# Patient Record
Sex: Female | Born: 1960 | Hispanic: Yes | Marital: Married | Smoking: Former smoker
Health system: Southern US, Community
[De-identification: ages and names within clinical notes are randomized; demographics above are authoritative.]

## PROBLEM LIST (undated history)

## (undated) DIAGNOSIS — E538 Deficiency of other specified B group vitamins: Secondary | ICD-10-CM

## (undated) DIAGNOSIS — E559 Vitamin D deficiency, unspecified: Secondary | ICD-10-CM

## (undated) DIAGNOSIS — M810 Age-related osteoporosis without current pathological fracture: Secondary | ICD-10-CM

## (undated) DIAGNOSIS — E785 Hyperlipidemia, unspecified: Secondary | ICD-10-CM

## (undated) HISTORY — PX: APPENDECTOMY: SHX54

## (undated) HISTORY — DX: Age-related osteoporosis without current pathological fracture: M81.0

## (undated) HISTORY — DX: Hyperlipidemia, unspecified: E78.5

## (undated) HISTORY — DX: Vitamin D deficiency, unspecified: E55.9

## (undated) HISTORY — DX: Deficiency of other specified B group vitamins: E53.8

---

## 2006-03-09 ENCOUNTER — Other Ambulatory Visit: Admission: RE | Admit: 2006-03-09 | Discharge: 2006-03-09 | Payer: Self-pay | Admitting: Family Medicine

## 2007-05-26 ENCOUNTER — Other Ambulatory Visit: Admission: RE | Admit: 2007-05-26 | Discharge: 2007-05-26 | Payer: Self-pay | Admitting: Family Medicine

## 2008-09-11 ENCOUNTER — Other Ambulatory Visit: Admission: RE | Admit: 2008-09-11 | Discharge: 2008-09-11 | Payer: Self-pay | Admitting: Family Medicine

## 2009-05-02 ENCOUNTER — Encounter: Admission: RE | Admit: 2009-05-02 | Discharge: 2009-05-02 | Payer: Self-pay | Admitting: Internal Medicine

## 2010-07-01 ENCOUNTER — Other Ambulatory Visit (HOSPITAL_COMMUNITY)
Admission: RE | Admit: 2010-07-01 | Discharge: 2010-07-01 | Disposition: A | Discharge status: Home / Self Care | Payer: Federal, State, Local not specified - PPO | Source: Ambulatory Visit | Attending: Family Medicine | Admitting: Family Medicine

## 2010-07-01 ENCOUNTER — Other Ambulatory Visit: Payer: Self-pay | Admitting: Family Medicine

## 2010-07-01 DIAGNOSIS — Z1159 Encounter for screening for other viral diseases: Secondary | ICD-10-CM | POA: Insufficient documentation

## 2010-07-01 DIAGNOSIS — Z124 Encounter for screening for malignant neoplasm of cervix: Secondary | ICD-10-CM | POA: Insufficient documentation

## 2013-11-25 ENCOUNTER — Other Ambulatory Visit: Payer: Self-pay | Admitting: Family Medicine

## 2013-11-25 ENCOUNTER — Other Ambulatory Visit (HOSPITAL_COMMUNITY)
Admission: RE | Admit: 2013-11-25 | Discharge: 2013-11-25 | Disposition: A | Discharge status: Home / Self Care | Payer: Federal, State, Local not specified - PPO | Source: Ambulatory Visit | Attending: Family Medicine | Admitting: Family Medicine

## 2013-11-25 DIAGNOSIS — Z1151 Encounter for screening for human papillomavirus (HPV): Secondary | ICD-10-CM | POA: Insufficient documentation

## 2013-11-25 DIAGNOSIS — Z124 Encounter for screening for malignant neoplasm of cervix: Secondary | ICD-10-CM | POA: Insufficient documentation

## 2013-11-29 ENCOUNTER — Other Ambulatory Visit: Payer: Self-pay | Admitting: Family Medicine

## 2013-11-29 ENCOUNTER — Ambulatory Visit
Admission: RE | Admit: 2013-11-29 | Discharge: 2013-11-29 | Disposition: A | Discharge status: Home / Self Care | Payer: Federal, State, Local not specified - PPO | Source: Ambulatory Visit | Attending: Family Medicine | Admitting: Family Medicine

## 2013-11-29 DIAGNOSIS — M25551 Pain in right hip: Secondary | ICD-10-CM

## 2013-11-29 DIAGNOSIS — M545 Low back pain: Secondary | ICD-10-CM

## 2013-11-30 LAB — CYTOLOGY - PAP

## 2015-02-05 ENCOUNTER — Other Ambulatory Visit: Payer: Self-pay | Admitting: Family Medicine

## 2015-02-05 ENCOUNTER — Ambulatory Visit
Admission: RE | Admit: 2015-02-05 | Discharge: 2015-02-05 | Disposition: A | Discharge status: Home / Self Care | Payer: Federal, State, Local not specified - PPO | Source: Ambulatory Visit | Attending: Family Medicine | Admitting: Family Medicine

## 2015-02-05 DIAGNOSIS — M549 Dorsalgia, unspecified: Secondary | ICD-10-CM

## 2015-09-20 DIAGNOSIS — K219 Gastro-esophageal reflux disease without esophagitis: Secondary | ICD-10-CM | POA: Insufficient documentation

## 2015-09-20 DIAGNOSIS — R09A2 Foreign body sensation, throat: Secondary | ICD-10-CM | POA: Insufficient documentation

## 2015-09-20 DIAGNOSIS — R490 Dysphonia: Secondary | ICD-10-CM | POA: Insufficient documentation

## 2018-02-10 ENCOUNTER — Other Ambulatory Visit (HOSPITAL_COMMUNITY)
Admission: RE | Admit: 2018-02-10 | Discharge: 2018-02-10 | Disposition: A | Discharge status: Home / Self Care | Payer: Federal, State, Local not specified - PPO | Source: Ambulatory Visit | Attending: Family Medicine | Admitting: Family Medicine

## 2018-02-10 ENCOUNTER — Other Ambulatory Visit: Payer: Self-pay | Admitting: Family Medicine

## 2018-02-10 DIAGNOSIS — E785 Hyperlipidemia, unspecified: Secondary | ICD-10-CM | POA: Diagnosis not present

## 2018-02-10 DIAGNOSIS — K219 Gastro-esophageal reflux disease without esophagitis: Secondary | ICD-10-CM | POA: Diagnosis not present

## 2018-02-10 DIAGNOSIS — R350 Frequency of micturition: Secondary | ICD-10-CM | POA: Diagnosis not present

## 2018-02-10 DIAGNOSIS — Z0000 Encounter for general adult medical examination without abnormal findings: Secondary | ICD-10-CM | POA: Diagnosis not present

## 2018-02-10 DIAGNOSIS — Z23 Encounter for immunization: Secondary | ICD-10-CM | POA: Diagnosis not present

## 2018-02-10 DIAGNOSIS — Z124 Encounter for screening for malignant neoplasm of cervix: Secondary | ICD-10-CM | POA: Diagnosis not present

## 2018-02-10 DIAGNOSIS — E559 Vitamin D deficiency, unspecified: Secondary | ICD-10-CM | POA: Diagnosis not present

## 2018-02-11 ENCOUNTER — Other Ambulatory Visit: Payer: Self-pay | Admitting: Family Medicine

## 2018-02-11 DIAGNOSIS — N644 Mastodynia: Secondary | ICD-10-CM

## 2018-02-12 LAB — CYTOLOGY - PAP
Diagnosis: NEGATIVE
HPV: NOT DETECTED

## 2018-03-12 ENCOUNTER — Ambulatory Visit
Admission: RE | Admit: 2018-03-12 | Discharge: 2018-03-12 | Disposition: A | Discharge status: Home / Self Care | Payer: Federal, State, Local not specified - PPO | Source: Ambulatory Visit | Attending: Family Medicine | Admitting: Family Medicine

## 2018-03-12 ENCOUNTER — Ambulatory Visit: Payer: Federal, State, Local not specified - PPO

## 2018-03-12 DIAGNOSIS — R922 Inconclusive mammogram: Secondary | ICD-10-CM | POA: Diagnosis not present

## 2018-03-12 DIAGNOSIS — N644 Mastodynia: Secondary | ICD-10-CM

## 2018-03-15 DIAGNOSIS — M810 Age-related osteoporosis without current pathological fracture: Secondary | ICD-10-CM | POA: Diagnosis not present

## 2018-03-15 DIAGNOSIS — Z780 Asymptomatic menopausal state: Secondary | ICD-10-CM | POA: Diagnosis not present

## 2018-03-18 DIAGNOSIS — G8929 Other chronic pain: Secondary | ICD-10-CM | POA: Diagnosis not present

## 2018-03-18 DIAGNOSIS — E559 Vitamin D deficiency, unspecified: Secondary | ICD-10-CM | POA: Diagnosis not present

## 2018-03-18 DIAGNOSIS — E2839 Other primary ovarian failure: Secondary | ICD-10-CM | POA: Diagnosis not present

## 2018-03-18 DIAGNOSIS — M810 Age-related osteoporosis without current pathological fracture: Secondary | ICD-10-CM | POA: Diagnosis not present

## 2018-03-29 DIAGNOSIS — M546 Pain in thoracic spine: Secondary | ICD-10-CM | POA: Diagnosis not present

## 2018-03-29 DIAGNOSIS — M542 Cervicalgia: Secondary | ICD-10-CM | POA: Diagnosis not present

## 2018-04-05 DIAGNOSIS — M542 Cervicalgia: Secondary | ICD-10-CM | POA: Diagnosis not present

## 2018-04-05 DIAGNOSIS — M546 Pain in thoracic spine: Secondary | ICD-10-CM | POA: Diagnosis not present

## 2018-04-09 DIAGNOSIS — M546 Pain in thoracic spine: Secondary | ICD-10-CM | POA: Diagnosis not present

## 2018-04-09 DIAGNOSIS — M542 Cervicalgia: Secondary | ICD-10-CM | POA: Diagnosis not present

## 2018-11-28 DIAGNOSIS — R079 Chest pain, unspecified: Secondary | ICD-10-CM | POA: Diagnosis not present

## 2018-11-28 DIAGNOSIS — R0781 Pleurodynia: Secondary | ICD-10-CM | POA: Diagnosis not present

## 2019-06-24 DIAGNOSIS — Z719 Counseling, unspecified: Secondary | ICD-10-CM | POA: Insufficient documentation

## 2019-06-29 DIAGNOSIS — L218 Other seborrheic dermatitis: Secondary | ICD-10-CM | POA: Diagnosis not present

## 2019-06-29 DIAGNOSIS — L728 Other follicular cysts of the skin and subcutaneous tissue: Secondary | ICD-10-CM | POA: Diagnosis not present

## 2019-08-12 DIAGNOSIS — L814 Other melanin hyperpigmentation: Secondary | ICD-10-CM | POA: Diagnosis not present

## 2020-04-26 DIAGNOSIS — L718 Other rosacea: Secondary | ICD-10-CM | POA: Diagnosis not present

## 2020-04-26 DIAGNOSIS — L738 Other specified follicular disorders: Secondary | ICD-10-CM | POA: Diagnosis not present

## 2020-08-28 IMAGING — MG DIGITAL DIAGNOSTIC BILATERAL MAMMOGRAM WITH TOMO AND CAD
6 of 10 series · 6 of 30 positions shown · non-contrast
Comparison: New baseline exam

CLINICAL DATA: The patient describes diffuse bilateral pain, worse
on the LEFT. LEFT breast feels heavy at times. The patient reports
drinking a lot more caffeine recently.

EXAM:
DIGITAL DIAGNOSTIC BILATERAL MAMMOGRAM WITH CAD AND TOMO

[R MLO synth-2D]
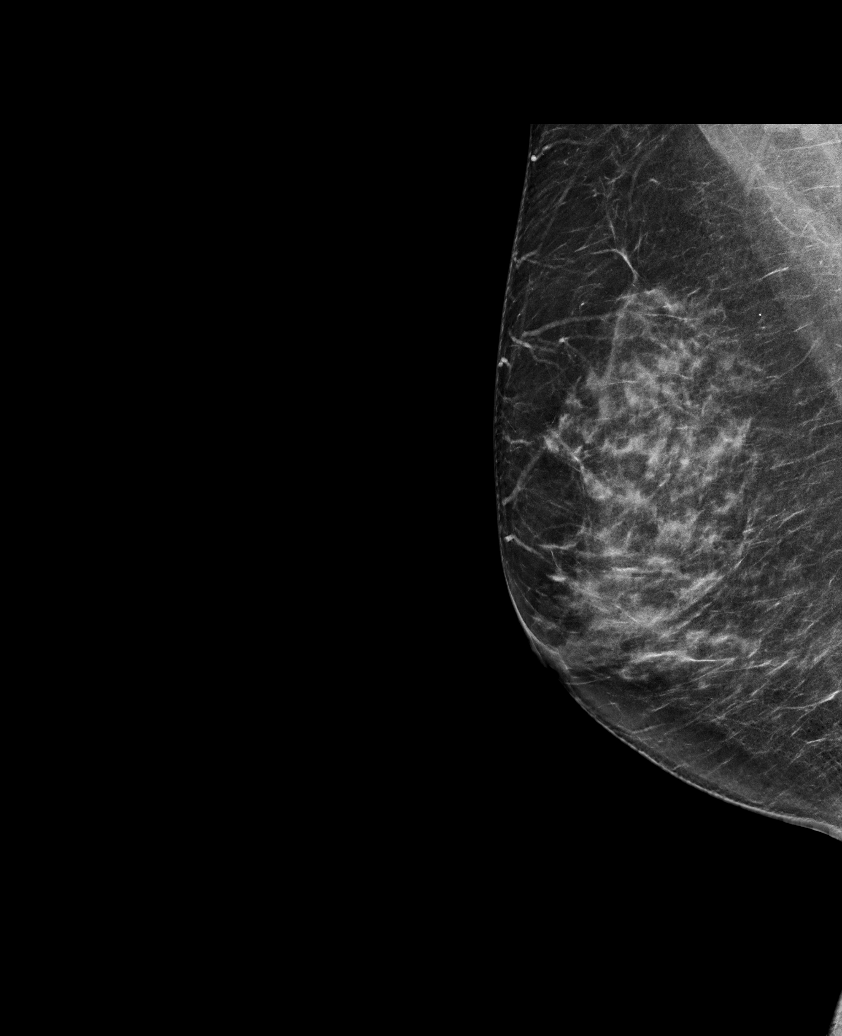

[L CC synth-2D (1 of 2)]
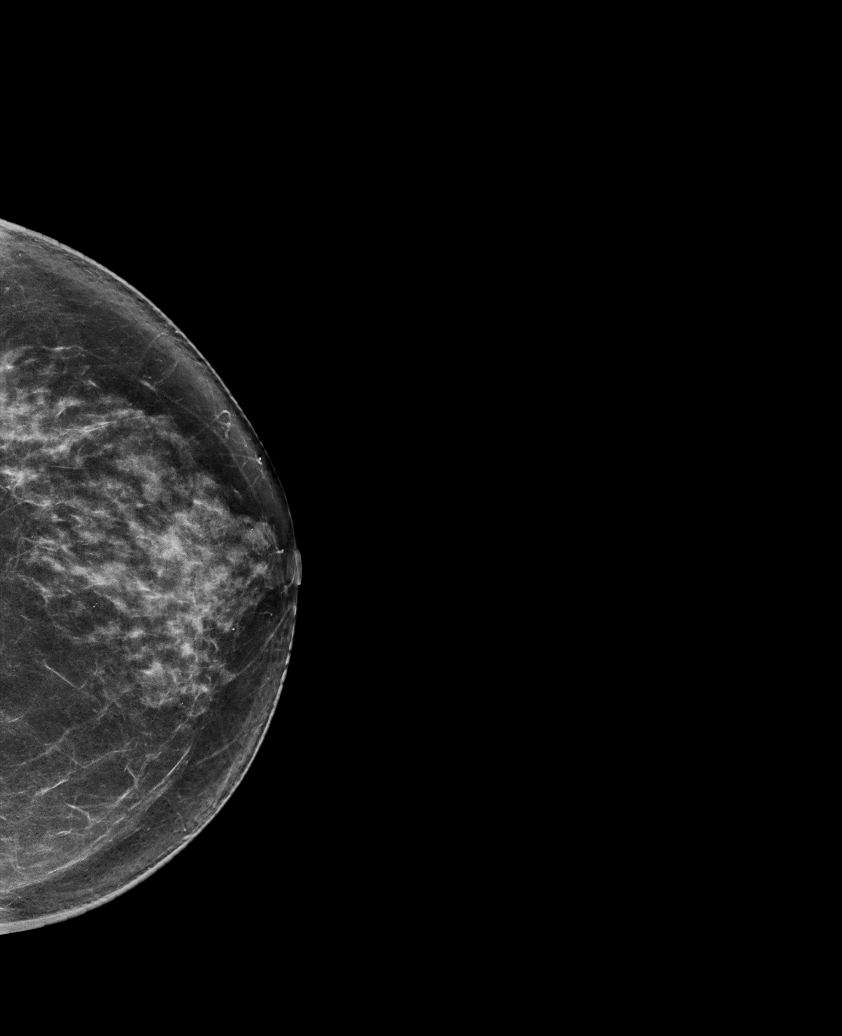

[L MLO synth-2D]
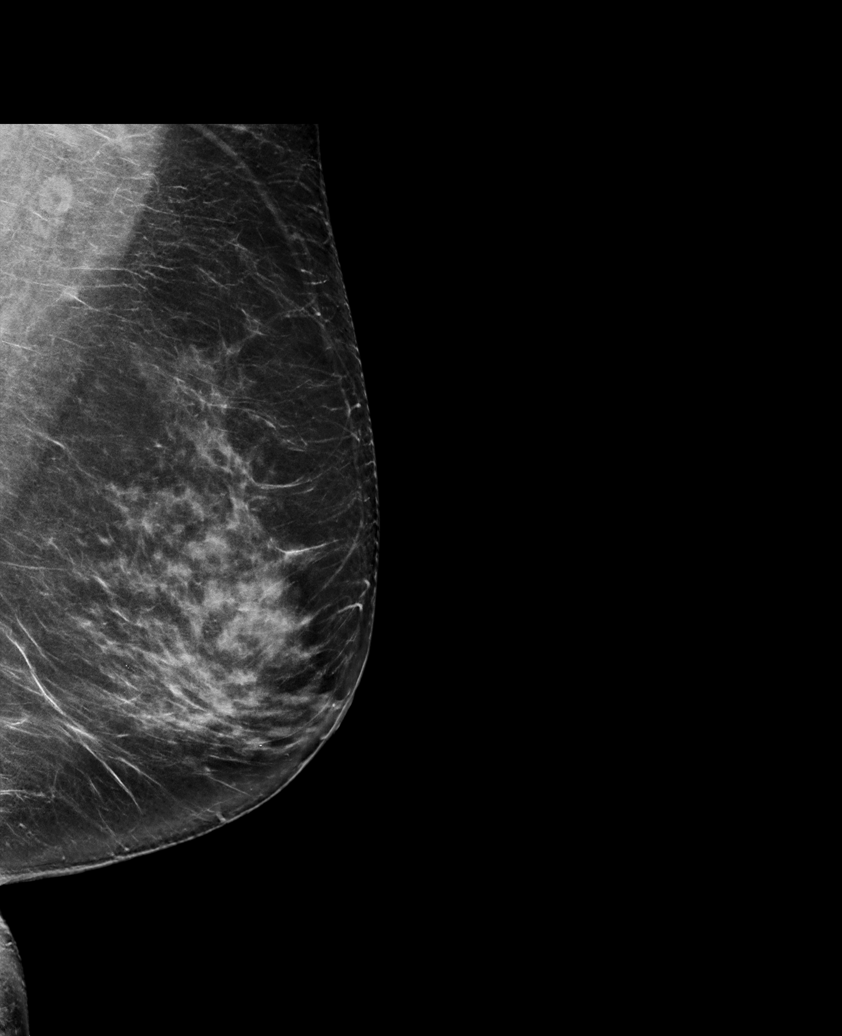

[L CC synth-2D (2 of 2)]
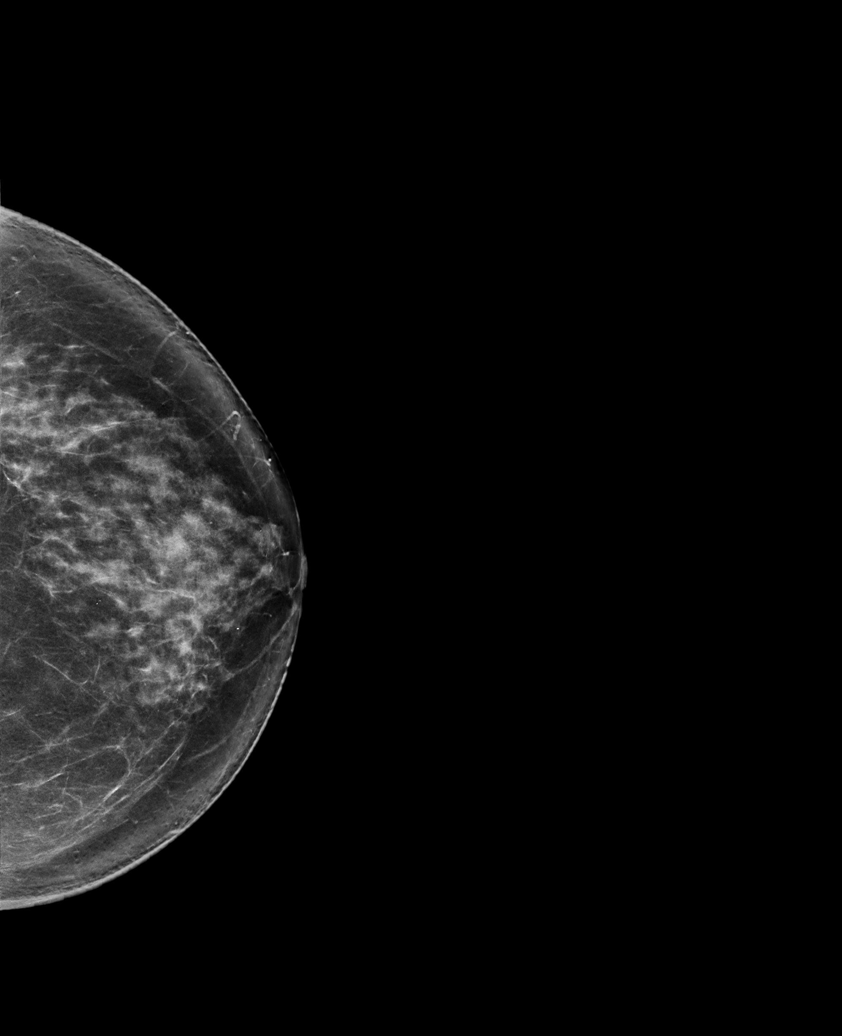

[R CC synth-2D]
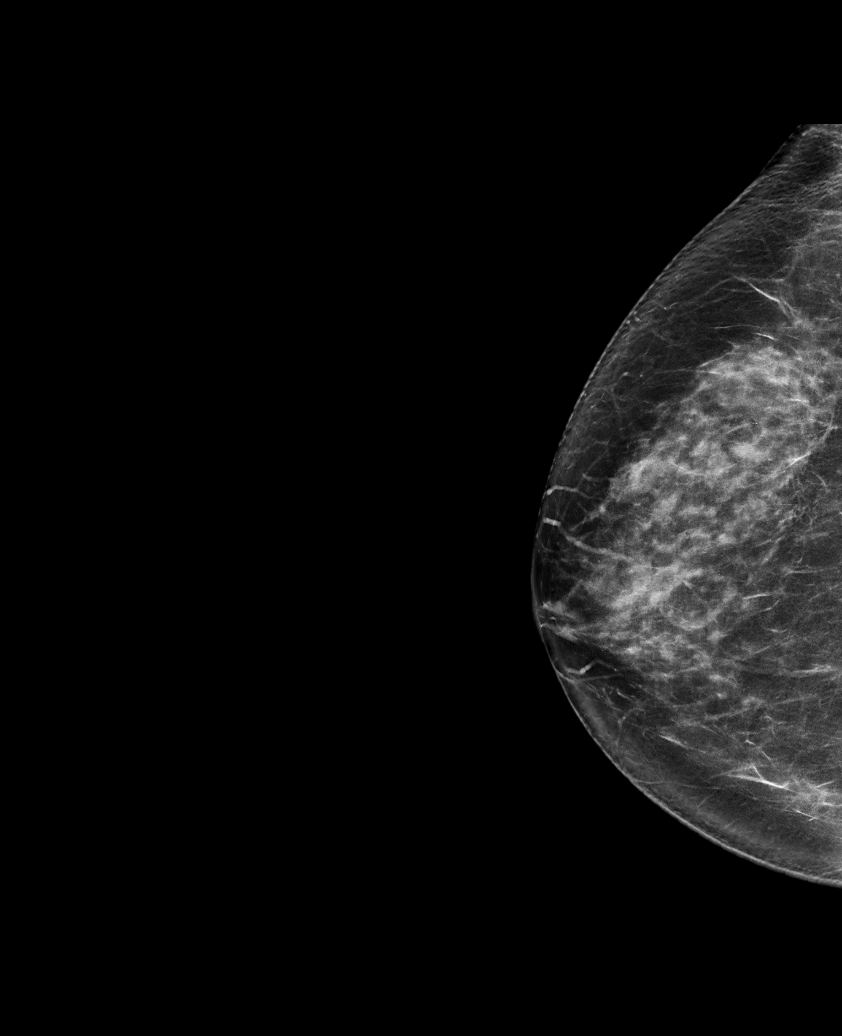

[L CC tomo · tomo slice 42/83.0]
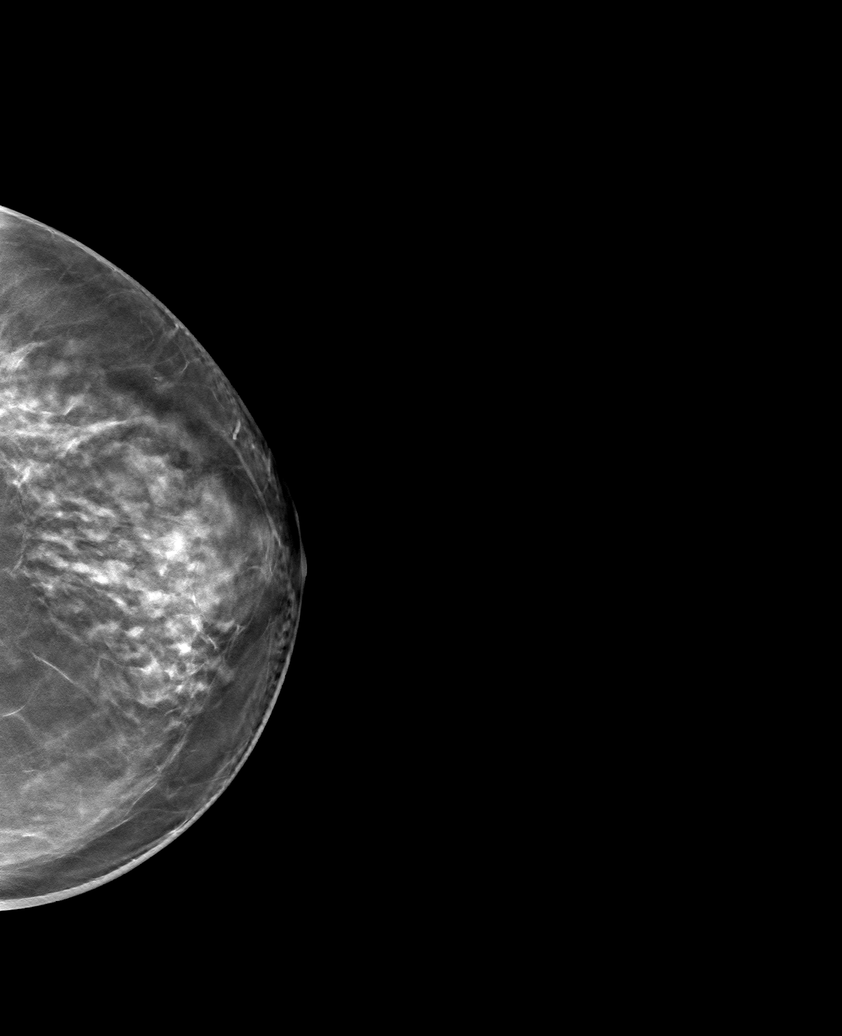

[6 of 30 positions shown; findings below may reference images not displayed]

ACR Breast Density Category c: The breast tissue is heterogeneously
dense, which may obscure small masses.
FINDINGS: No suspicious mass, distortion, or microcalcifications are
identified to suggest presence of malignancy.

Mammographic images were processed with CAD.
IMPRESSION: No mammographic evidence for malignancy.We discussed some of the
issues regarding breast pain, including limiting caffeine.

RECOMMENDATION:
Screening mammogram in one year.(Code:64-1-G79)

I have discussed the findings and recommendations with the patient.
Results were also provided in writing at the conclusion of the
visit. If applicable, a reminder letter will be sent to the patient
regarding the next appointment.

BI-RADS CATEGORY  1: Negative.

## 2020-09-17 DIAGNOSIS — E559 Vitamin D deficiency, unspecified: Secondary | ICD-10-CM | POA: Diagnosis not present

## 2020-09-17 DIAGNOSIS — Z0000 Encounter for general adult medical examination without abnormal findings: Secondary | ICD-10-CM | POA: Diagnosis not present

## 2020-09-17 DIAGNOSIS — Z1321 Encounter for screening for nutritional disorder: Secondary | ICD-10-CM | POA: Diagnosis not present

## 2020-09-17 DIAGNOSIS — E785 Hyperlipidemia, unspecified: Secondary | ICD-10-CM | POA: Diagnosis not present

## 2020-10-03 LAB — LIPID PANEL
Cholesterol: 256 — AB (ref 0–200)
HDL: 208 — AB (ref 35–70)
LDL Cholesterol: 191
LDl/HDL Ratio: 5.3
Triglycerides: 97 (ref 40–160)

## 2020-10-03 LAB — COMPREHENSIVE METABOLIC PANEL: Calcium: 9.5 (ref 8.7–10.7)

## 2020-10-03 LAB — CBC AND DIFFERENTIAL
HCT: 41 (ref 36–46)
Hemoglobin: 13.7 (ref 12.0–16.0)
WBC: 6

## 2020-10-03 LAB — BASIC METABOLIC PANEL
Creatinine: 0.8 (ref 0.5–1.1)
Glucose: 90

## 2020-10-03 LAB — HEPATIC FUNCTION PANEL
ALT: 20 (ref 7–35)
AST: 20 (ref 13–35)

## 2020-10-03 LAB — VITAMIN B12: Vitamin B-12: 248

## 2020-10-03 LAB — CBC: RBC: 4.54 (ref 3.87–5.11)

## 2020-10-03 LAB — VITAMIN D 25 HYDROXY (VIT D DEFICIENCY, FRACTURES): Vit D, 25-Hydroxy: 22.2

## 2020-10-04 DIAGNOSIS — M85851 Other specified disorders of bone density and structure, right thigh: Secondary | ICD-10-CM | POA: Diagnosis not present

## 2020-10-04 DIAGNOSIS — Z1231 Encounter for screening mammogram for malignant neoplasm of breast: Secondary | ICD-10-CM | POA: Diagnosis not present

## 2020-10-04 DIAGNOSIS — M810 Age-related osteoporosis without current pathological fracture: Secondary | ICD-10-CM | POA: Diagnosis not present

## 2020-10-18 ENCOUNTER — Other Ambulatory Visit: Payer: Self-pay

## 2020-10-19 ENCOUNTER — Encounter: Payer: Self-pay | Admitting: Internal Medicine

## 2020-10-19 ENCOUNTER — Ambulatory Visit: Payer: Federal, State, Local not specified - PPO | Admitting: Internal Medicine

## 2020-10-19 VITALS — BP 110/70 | HR 59 | Temp 98.7°F | Ht 59.06 in | Wt 126.1 lb

## 2020-10-19 DIAGNOSIS — E782 Mixed hyperlipidemia: Secondary | ICD-10-CM

## 2020-10-19 DIAGNOSIS — E538 Deficiency of other specified B group vitamins: Secondary | ICD-10-CM | POA: Diagnosis not present

## 2020-10-19 DIAGNOSIS — Z23 Encounter for immunization: Secondary | ICD-10-CM | POA: Diagnosis not present

## 2020-10-19 DIAGNOSIS — E785 Hyperlipidemia, unspecified: Secondary | ICD-10-CM | POA: Insufficient documentation

## 2020-10-19 DIAGNOSIS — M810 Age-related osteoporosis without current pathological fracture: Secondary | ICD-10-CM

## 2020-10-19 DIAGNOSIS — E559 Vitamin D deficiency, unspecified: Secondary | ICD-10-CM | POA: Insufficient documentation

## 2020-10-19 MED ORDER — ATORVASTATIN CALCIUM 40 MG PO TABS: 40.0000 mg | ORAL_TABLET | Freq: Every day | ORAL | 1 refills | Status: DC

## 2020-10-19 NOTE — Progress Notes (Signed)
New Patient Office Visit     This visit occurred during the SARS-CoV-2 public health emergency.  Safety protocols were in place, including screening questions prior to the visit, additional usage of staff PPE, and extensive cleaning of exam room while observing appropriate contact time as indicated for disinfecting solutions.    CC/Reason for Visit: Establish care, discuss chronic medical conditions Previous PCP: Sigmund Hazel, MD Last Visit: July 2022  HPI: Cathy Camacho is a 60 y.o. female who is coming in today for the above mentioned reasons. Past Medical History is significant for: Osteoporosis on alendronate, vitamin D and vitamin B12 deficiencies as well as recently diagnosed hyperlipidemia with an LDL cholesterol of 197.  She is doing well today and has no acute concerns.  She has had all 4 COVID vaccines, had a Tdap in 2018.  She is overdue for shingles vaccinations.  Her colonoscopy will be due in 2024, she had a mammogram last month, she follows routinely with GYN.   Past Medical/Surgical History: Past Medical History:  Diagnosis Date   Hyperlipidemia    Osteoporosis    Vitamin B12 deficiency    Vitamin D deficiency     History reviewed. No pertinent surgical history.  Social History:  reports that she has quit smoking. Her smoking use included cigarettes. She has never used smokeless tobacco. She reports that she does not drink alcohol and does not use drugs.  Allergies: No Known Allergies  Family History:  Family History  Problem Relation Age of Onset   Alzheimer's disease Mother      Current Outpatient Medications:    atorvastatin (LIPITOR) 40 MG tablet, Take 1 tablet (40 mg total) by mouth daily., Disp: 90 tablet, Rfl: 1   vitamin B-12 (CYANOCOBALAMIN) 1000 MCG tablet, Take 1,000 mcg by mouth daily., Disp: , Rfl:    Vitamin D, Ergocalciferol, (DRISDOL) 1.25 MG (50000 UNIT) CAPS capsule, Take 50,000 Units by mouth every 7 (seven) days., Disp: ,  Rfl:    alendronate (FOSAMAX) 35 MG tablet, PLEASE SEE ATTACHED FOR DETAILED DIRECTIONS, Disp: , Rfl:   Review of Systems:  Constitutional: Denies fever, chills, diaphoresis, appetite change and fatigue.  HEENT: Denies photophobia, eye pain, redness, hearing loss, ear pain, congestion, sore throat, rhinorrhea, sneezing, mouth sores, trouble swallowing, neck pain, neck stiffness and tinnitus.   Respiratory: Denies SOB, DOE, cough, chest tightness,  and wheezing.   Cardiovascular: Denies chest pain, palpitations and leg swelling.  Gastrointestinal: Denies nausea, vomiting, abdominal pain, diarrhea, constipation, blood in stool and abdominal distention.  Genitourinary: Denies dysuria, urgency, frequency, hematuria, flank pain and difficulty urinating.  Endocrine: Denies: hot or cold intolerance, sweats, changes in hair or nails, polyuria, polydipsia. Musculoskeletal: Denies myalgias, back pain, joint swelling, arthralgias and gait problem.  Skin: Denies pallor, rash and wound.  Neurological: Denies dizziness, seizures, syncope, weakness, light-headedness, numbness and headaches.  Hematological: Denies adenopathy. Easy bruising, personal or family bleeding history  Psychiatric/Behavioral: Denies suicidal ideation, mood changes, confusion, nervousness, sleep disturbance and agitation    Physical Exam: Vitals:   10/19/20 1553  BP: 110/70  Pulse: (!) 59  Temp: 98.7 F (37.1 C)  TempSrc: Oral  SpO2: 99%  Weight: 126 lb 1.6 oz (57.2 kg)  Height: 4' 11.06" (1.5 m)   Body mass index is 25.42 kg/m.  Constitutional: NAD, calm, comfortable Eyes: PERRL, lids and conjunctivae normal ENMT: Mucous membranes are moist.  Respiratory: clear to auscultation bilaterally, no wheezing, no crackles. Normal respiratory effort. No accessory muscle use.  Cardiovascular: Regular rate and rhythm, no murmurs / rubs / gallops. No extremity edema.  Neurologic: Grossly intact and nonfocal Psychiatric: Normal  judgment and insight. Alert and oriented x 3. Normal mood.    Impression and Plan:  Mixed hyperlipidemia  -We have discussed healthy lifestyle changes. -Start atorvastatin 40 mg daily and return in 3 months for repeat fasting lipids.  Vitamin D deficiency -On weekly high-dose supplementation, recheck levels in 3 to 6 months.  Vitamin B12 deficiency -On daily oral supplementation.  Osteoporosis without current pathological fracture, unspecified osteoporosis type -We will obtain results of most recent DEXA scan, for now she is on alendronate 35 mg weekly.  Need for shingles vaccine -For shingles vaccine administered today.     Patient Instructions  -Nice seeing you today!!  -Start atorvastatin 40 mg daily.  -First shingles vaccine today.  -Schedule follow up in 3 months for labs.    Chaya Jan, MD [facility] at [facility]

## 2020-10-19 NOTE — Patient Instructions (Addendum)
-  Nice seeing you today!!  -Start atorvastatin 40 mg daily.  -First shingles vaccine today.  -Schedule follow up in 3 months for labs.

## 2020-10-19 NOTE — Addendum Note (Signed)
Addended by: Johnella Moloney on: 10/19/2020 04:58 PM   Modules accepted: Orders

## 2020-12-21 ENCOUNTER — Ambulatory Visit: Payer: Federal, State, Local not specified - PPO

## 2020-12-24 ENCOUNTER — Other Ambulatory Visit: Payer: Self-pay

## 2020-12-24 ENCOUNTER — Ambulatory Visit (INDEPENDENT_AMBULATORY_CARE_PROVIDER_SITE_OTHER): Payer: Federal, State, Local not specified - PPO | Admitting: *Deleted

## 2020-12-24 DIAGNOSIS — Z23 Encounter for immunization: Secondary | ICD-10-CM

## 2021-01-21 ENCOUNTER — Other Ambulatory Visit (INDEPENDENT_AMBULATORY_CARE_PROVIDER_SITE_OTHER): Payer: Federal, State, Local not specified - PPO

## 2021-01-21 DIAGNOSIS — E782 Mixed hyperlipidemia: Secondary | ICD-10-CM

## 2021-01-21 LAB — LIPID PANEL
Cholesterol: 149 mg/dL (ref 0–200)
HDL: 50.3 mg/dL (ref >39.00)
LDL Cholesterol: 86 mg/dL (ref 0–99)
NonHDL: 98.51
Total CHOL/HDL Ratio: 3
Triglycerides: 65 mg/dL (ref 0.0–149.0)
VLDL: 13 mg/dL (ref 0.0–40.0)

## 2021-01-22 ENCOUNTER — Other Ambulatory Visit: Payer: Self-pay | Admitting: Internal Medicine

## 2021-01-22 DIAGNOSIS — E782 Mixed hyperlipidemia: Secondary | ICD-10-CM

## 2021-01-22 MED ORDER — ATORVASTATIN CALCIUM 40 MG PO TABS: 40.0000 mg | ORAL_TABLET | Freq: Every day | ORAL | 1 refills | Status: DC

## 2021-04-01 ENCOUNTER — Other Ambulatory Visit: Payer: Self-pay | Admitting: Internal Medicine

## 2021-04-01 DIAGNOSIS — N644 Mastodynia: Secondary | ICD-10-CM

## 2021-04-01 NOTE — Progress Notes (Signed)
Diagnostic

## 2021-05-22 ENCOUNTER — Other Ambulatory Visit: Payer: Self-pay | Admitting: Internal Medicine

## 2021-05-22 DIAGNOSIS — Z1231 Encounter for screening mammogram for malignant neoplasm of breast: Secondary | ICD-10-CM

## 2021-05-27 ENCOUNTER — Telehealth: Payer: Self-pay | Admitting: Internal Medicine

## 2021-05-27 NOTE — Telephone Encounter (Signed)
Order was faxed and confirmed.

## 2021-05-27 NOTE — Telephone Encounter (Signed)
[facility] is requesting a referral to be sent over for patient before her appt tomorrow. She stated that it could either be verbal or faxed over.  ? ?Cathy Camacho could be contacted at [phone]. ? ?Please advise. ?

## 2021-05-28 ENCOUNTER — Encounter: Payer: Self-pay | Admitting: Internal Medicine

## 2021-05-28 DIAGNOSIS — N644 Mastodynia: Secondary | ICD-10-CM | POA: Diagnosis not present

## 2021-05-28 DIAGNOSIS — R922 Inconclusive mammogram: Secondary | ICD-10-CM | POA: Diagnosis not present

## 2021-05-28 LAB — HM MAMMOGRAPHY

## 2021-06-13 ENCOUNTER — Ambulatory Visit: Payer: Federal, State, Local not specified - PPO | Admitting: Internal Medicine

## 2021-07-31 ENCOUNTER — Other Ambulatory Visit: Payer: Self-pay | Admitting: Internal Medicine

## 2021-07-31 DIAGNOSIS — E782 Mixed hyperlipidemia: Secondary | ICD-10-CM

## 2021-08-20 DIAGNOSIS — L538 Other specified erythematous conditions: Secondary | ICD-10-CM | POA: Diagnosis not present

## 2021-08-20 DIAGNOSIS — L820 Inflamed seborrheic keratosis: Secondary | ICD-10-CM | POA: Diagnosis not present

## 2021-08-20 DIAGNOSIS — L298 Other pruritus: Secondary | ICD-10-CM | POA: Diagnosis not present

## 2021-08-20 DIAGNOSIS — Z789 Other specified health status: Secondary | ICD-10-CM | POA: Diagnosis not present

## 2021-10-14 ENCOUNTER — Other Ambulatory Visit: Payer: Self-pay | Admitting: Internal Medicine

## 2021-10-14 DIAGNOSIS — E782 Mixed hyperlipidemia: Secondary | ICD-10-CM

## 2021-10-14 MED ORDER — ATORVASTATIN CALCIUM 40 MG PO TABS: ORAL_TABLET | ORAL | 0 refills | Status: DC

## 2022-01-09 ENCOUNTER — Encounter: Payer: Self-pay | Admitting: Internal Medicine

## 2022-01-09 ENCOUNTER — Ambulatory Visit (INDEPENDENT_AMBULATORY_CARE_PROVIDER_SITE_OTHER): Payer: Federal, State, Local not specified - PPO | Admitting: Internal Medicine

## 2022-01-09 VITALS — BP 110/70 | HR 70 | Temp 97.8°F | Ht 60.0 in | Wt 126.2 lb

## 2022-01-09 DIAGNOSIS — G8929 Other chronic pain: Secondary | ICD-10-CM

## 2022-01-09 DIAGNOSIS — E538 Deficiency of other specified B group vitamins: Secondary | ICD-10-CM | POA: Diagnosis not present

## 2022-01-09 DIAGNOSIS — E559 Vitamin D deficiency, unspecified: Secondary | ICD-10-CM | POA: Diagnosis not present

## 2022-01-09 DIAGNOSIS — E782 Mixed hyperlipidemia: Secondary | ICD-10-CM

## 2022-01-09 DIAGNOSIS — Z0001 Encounter for general adult medical examination with abnormal findings: Secondary | ICD-10-CM | POA: Diagnosis not present

## 2022-01-09 DIAGNOSIS — H539 Unspecified visual disturbance: Secondary | ICD-10-CM

## 2022-01-09 DIAGNOSIS — R202 Paresthesia of skin: Secondary | ICD-10-CM | POA: Diagnosis not present

## 2022-01-09 DIAGNOSIS — R519 Headache, unspecified: Secondary | ICD-10-CM

## 2022-01-09 DIAGNOSIS — Z23 Encounter for immunization: Secondary | ICD-10-CM

## 2022-01-09 DIAGNOSIS — Z124 Encounter for screening for malignant neoplasm of cervix: Secondary | ICD-10-CM

## 2022-01-09 DIAGNOSIS — M810 Age-related osteoporosis without current pathological fracture: Secondary | ICD-10-CM

## 2022-01-09 DIAGNOSIS — Z0000 Encounter for general adult medical examination without abnormal findings: Secondary | ICD-10-CM

## 2022-01-09 LAB — CBC WITH DIFFERENTIAL/PLATELET
Basophils Absolute: 0 10*3/uL (ref 0.0–0.1)
Basophils Relative: 0.7 % (ref 0.0–3.0)
Eosinophils Absolute: 0.1 10*3/uL (ref 0.0–0.7)
Eosinophils Relative: 2 % (ref 0.0–5.0)
HCT: 41.6 % (ref 36.0–46.0)
Hemoglobin: 14.1 g/dL (ref 12.0–15.0)
Lymphocytes Relative: 47.2 % — ABNORMAL HIGH (ref 12.0–46.0)
Lymphs Abs: 2.4 10*3/uL (ref 0.7–4.0)
MCHC: 33.8 g/dL (ref 30.0–36.0)
MCV: 91.6 fl (ref 78.0–100.0)
Monocytes Absolute: 0.3 10*3/uL (ref 0.1–1.0)
Monocytes Relative: 5.3 % (ref 3.0–12.0)
Neutro Abs: 2.3 10*3/uL (ref 1.4–7.7)
Neutrophils Relative %: 44.8 % (ref 43.0–77.0)
Platelets: 249 10*3/uL (ref 150.0–400.0)
RBC: 4.54 Mil/uL (ref 3.87–5.11)
RDW: 13.3 % (ref 11.5–15.5)
WBC: 5.1 10*3/uL (ref 4.0–10.5)

## 2022-01-09 LAB — VITAMIN B12: Vitamin B-12: 624 pg/mL (ref 211–911)

## 2022-01-09 LAB — COMPREHENSIVE METABOLIC PANEL
ALT: 27 U/L (ref 0–35)
AST: 27 U/L (ref 0–37)
Albumin: 4.6 g/dL (ref 3.5–5.2)
Alkaline Phosphatase: 73 U/L (ref 39–117)
BUN: 13 mg/dL (ref 6–23)
CO2: 28 mEq/L (ref 19–32)
Calcium: 9.6 mg/dL (ref 8.4–10.5)
Chloride: 105 mEq/L (ref 96–112)
Creatinine, Ser: 0.78 mg/dL (ref 0.40–1.20)
GFR: 82.13 mL/min (ref >60.00)
Glucose, Bld: 94 mg/dL (ref 70–99)
Potassium: 4.3 mEq/L (ref 3.5–5.1)
Sodium: 140 mEq/L (ref 135–145)
Total Bilirubin: 0.5 mg/dL (ref 0.2–1.2)
Total Protein: 7.6 g/dL (ref 6.0–8.3)

## 2022-01-09 LAB — LIPID PANEL
Cholesterol: 179 mg/dL (ref 0–200)
HDL: 50.8 mg/dL (ref >39.00)
LDL Cholesterol: 115 mg/dL — ABNORMAL HIGH (ref 0–99)
NonHDL: 127.79
Total CHOL/HDL Ratio: 4
Triglycerides: 63 mg/dL (ref 0.0–149.0)
VLDL: 12.6 mg/dL (ref 0.0–40.0)

## 2022-01-09 LAB — TSH: TSH: 1.77 u[IU]/mL (ref 0.35–5.50)

## 2022-01-09 LAB — HEMOGLOBIN A1C: Hgb A1c MFr Bld: 5.8 % (ref 4.6–6.5)

## 2022-01-09 LAB — VITAMIN D 25 HYDROXY (VIT D DEFICIENCY, FRACTURES): VITD: 32.38 ng/mL (ref 30.00–100.00)

## 2022-01-09 MED ORDER — ROSUVASTATIN CALCIUM 10 MG PO TABS: 10.0000 mg | ORAL_TABLET | Freq: Every day | ORAL | 1 refills | Status: DC

## 2022-01-09 NOTE — Progress Notes (Signed)
Established Patient Office Visit     CC/Reason for Visit: Annual preventive exam, follow-up chronic conditions and discuss acute concerns  HPI: Cathy Camacho is a 61 y.o. female who is coming in today for the above mentioned reasons. Past Medical History is significant for: Osteoporosis, hyperlipidemia, vitamin D and B12 deficiency.  She is overdue for an eye exam, she has routine dental care, she exercises routinely.  She is overdue for flu vaccine.  She is overdue for well woman exam and is requesting GYN referral.  Breast and colon cancer screenings are up-to-date.  She has been having right-sided hemicranial headaches now for years.  Lately she has also been having what she describes as a tingling sensation over the crown and front side of her head, on the right side, as well as the right side of her forehead.  She is very concerned as her sister was recently diagnosed and passed away from GBM.  She is requesting referral to neurology.  She read that atorvastatin has an interaction with grapes.  She states that when she eats grapes she feels like her throat is closing up, this never happened before being on atorvastatin.  She quit eating grapes and has not had this recurring sensation.  She is requesting transition to a different statin.   Past Medical/Surgical History: Past Medical History:  Diagnosis Date   Hyperlipidemia    Osteoporosis    Vitamin B12 deficiency    Vitamin D deficiency     No past surgical history on file.  Social History:  reports that she has quit smoking. Her smoking use included cigarettes. She has never used smokeless tobacco. She reports that she does not drink alcohol and does not use drugs.  Allergies: No Known Allergies  Family History:  Family History  Problem Relation Age of Onset   Alzheimer's disease Mother      Current Outpatient Medications:    rosuvastatin (CRESTOR) 10 MG tablet, Take 1 tablet (10 mg total) by mouth daily.,  Disp: 90 tablet, Rfl: 1   alendronate (FOSAMAX) 35 MG tablet, PLEASE SEE ATTACHED FOR DETAILED DIRECTIONS (Patient not taking: Reported on 01/09/2022), Disp: , Rfl:    vitamin B-12 (CYANOCOBALAMIN) 1000 MCG tablet, Take 1,000 mcg by mouth daily. (Patient not taking: Reported on 01/09/2022), Disp: , Rfl:    Vitamin D, Ergocalciferol, (DRISDOL) 1.25 MG (50000 UNIT) CAPS capsule, Take 50,000 Units by mouth every 7 (seven) days. (Patient not taking: Reported on 01/09/2022), Disp: , Rfl:   Review of Systems:  Constitutional: Denies fever, chills, diaphoresis, appetite change and fatigue.  HEENT: Denies photophobia, eye pain, redness, hearing loss, ear pain, congestion, sore throat, rhinorrhea, sneezing, mouth sores, trouble swallowing, neck pain, neck stiffness and tinnitus.   Respiratory: Denies SOB, DOE, cough, chest tightness,  and wheezing.   Cardiovascular: Denies chest pain, palpitations and leg swelling.  Gastrointestinal: Denies nausea, vomiting, abdominal pain, diarrhea, constipation, blood in stool and abdominal distention.  Genitourinary: Denies dysuria, urgency, frequency, hematuria, flank pain and difficulty urinating.  Endocrine: Denies: hot or cold intolerance, sweats, changes in hair or nails, polyuria, polydipsia. Musculoskeletal: Denies myalgias, back pain, joint swelling, arthralgias and gait problem.  Skin: Denies pallor, rash and wound.  Neurological: Denies dizziness, seizures, syncope, weakness, light-headedness. Hematological: Denies adenopathy. Easy bruising, personal or family bleeding history  Psychiatric/Behavioral: Denies suicidal ideation, mood changes, confusion, nervousness, sleep disturbance and agitation    Physical Exam: Vitals:   01/09/22 0830  BP: 110/70  Pulse: 70  Temp: 97.8 F (36.6 C)  TempSrc: Oral  SpO2: 98%  Weight: 126 lb 3.2 oz (57.2 kg)  Height: 5' (1.524 m)    Body mass index is 24.65 kg/m.   Constitutional: NAD, calm,  comfortable Eyes: PERRL, lids and conjunctivae normal ENMT: Mucous membranes are moist. Posterior pharynx clear of any exudate or lesions. Normal dentition. Tympanic membrane is pearly white, no erythema or bulging. Neck: normal, supple, no masses, no thyromegaly Respiratory: clear to auscultation bilaterally, no wheezing, no crackles. Normal respiratory effort. No accessory muscle use.  Cardiovascular: Regular rate and rhythm, no murmurs / rubs / gallops. No extremity edema. 2+ pedal pulses. No carotid bruits.  Abdomen: no tenderness, no masses palpated. No hepatosplenomegaly. Bowel sounds positive.  Musculoskeletal: no clubbing / cyanosis. No joint deformity upper and lower extremities. Good ROM, no contractures. Normal muscle tone.  Skin: no rashes, lesions, ulcers. No induration, no scalp lesions evident. Neurologic: CN 2-12 grossly intact. Sensation intact, DTR normal. Strength 5/5 in all 4.  Psychiatric: Normal judgment and insight. Alert and oriented x 3. Normal mood.   [facility] Visit from 01/09/2022 in [facility] [facility] [place]  PHQ-9 Total Score 0         Impression and Plan:  Screening for cervical cancer - Plan: Ambulatory referral to Obstetrics / Gynecology  Encounter for preventive health examination  Osteoporosis without current pathological fracture, unspecified osteoporosis type  Mixed hyperlipidemia - Plan: CBC with Differential/Platelet, Comprehensive metabolic panel, Hemoglobin A1c, Lipid panel, rosuvastatin (CRESTOR) 10 MG tablet  Vitamin D deficiency - Plan: VITAMIN D 25 Hydroxy (Vit-D Deficiency, Fractures)  Vitamin B12 deficiency - Plan: Vitamin B12  Tingling sensation in face - Plan: TSH, CT HEAD WO CONTRAST (5MM)  Chronic nonintractable headache, unspecified headache type - Plan: CT HEAD WO CONTRAST (5MM)    -Recommend routine eye and dental care. -Immunizations: Flu vaccine in office today, all other immunizations are  up-to-date -Healthy lifestyle discussed in detail. -Labs to be updated today. -Colon cancer screening: 09/2012 -Breast cancer screening: 05/2021 -Cervical cancer screening: GYN referral placed -Lung cancer screening: Not applicable -Prostate cancer screening: Not applicable -DEXA: Requested today due to history of osteoporosis.  -With her longstanding headaches and tingling of scalp and right side of forehead, I think CT scan of head is indicated.  I think it might alleviate her concerns regarding brain malignancy.  Neurology referral has been requested by patient so I will place. -She does have a history of osteoporosis so DEXA will be ordered. -Ophthalmology referral placed as she is overdue for eye exam. -She is requesting transitioning off atorvastatin and going to rosuvastatin, I will start at 10 mg, she will need repeat lipids in 6 months. (I think she is confusing grapes with grapefruit).     Lelon Frohlich, MD [facility] at [facility]

## 2022-01-11 ENCOUNTER — Other Ambulatory Visit: Payer: Self-pay | Admitting: Internal Medicine

## 2022-01-11 DIAGNOSIS — E782 Mixed hyperlipidemia: Secondary | ICD-10-CM

## 2022-02-04 ENCOUNTER — Encounter: Payer: Self-pay | Admitting: *Deleted

## 2022-02-13 NOTE — Progress Notes (Addendum)
Referring:  Philip Aspen, Limmie Patricia, MD [address] [place],  [place] [address]  PCP: Philip Aspen, Limmie Patricia, MD  Neurology was asked to evaluate Cathy Camacho, a 61 year old female for a chief complaint of headaches.  Our recommendations of care will be communicated by shared medical record.    CC:  headaches  History provided from self  HPI:  Medical co-morbidities: HLD, B12 deficiency  The patient presents for evaluation of headaches which began one year ago. Headaches are described as right occiput and scalp tenderness. States she does not feel pain in her head, only on her scalp. She has also reported paresthesias over her scalp and forehead. Pain is not associated with photophobia, phonophobia, or nausea. No vision changes or temporal tenderness. Pain can last for 2 days at a time. She will take Tylenol as needed which does help.   Notes she does have a history of neck pain and arthritis in her neck.  She also notes that her sister recently passed away from brain cancer. Her PCP has ordered a CT of her brain. This has not yet been done.  Headache History: Onset: 1 year ago Triggers: touching the scalp Aura: no Location: right occiput, scalp Quality/Description: tenderness Associated Symptoms:  Photophobia: no  Phonophobia: no  Nausea: no Worse with activity?: no Duration of headaches: 2 days  Headache days per month: 15 Headache free days per month: 15  Current Treatment: Abortive Tylenol  Preventative none  Prior Therapies                                 Tylenol   LABS: CBC    Component Value Date/Time   WBC 5.1 01/09/2022 0907   RBC 4.54 01/09/2022 0907   HGB 14.1 01/09/2022 0907   HCT 41.6 01/09/2022 0907   PLT 249.0 01/09/2022 0907   MCV 91.6 01/09/2022 0907   MCHC 33.8 01/09/2022 0907   RDW 13.3 01/09/2022 0907   LYMPHSABS 2.4 01/09/2022 0907   MONOABS 0.3 01/09/2022 0907   EOSABS 0.1 01/09/2022 0907   BASOSABS 0.0  01/09/2022 0907      Latest Ref Rng & Units 01/09/2022    9:07 AM 10/03/2020   12:00 AM  CMP  Glucose 70 - 99 mg/dL 94    BUN 6 - 23 mg/dL 13    Creatinine 9.20 - 1.20 mg/dL 1.00  0.8      Sodium 712 - 145 mEq/L 140    Potassium 3.5 - 5.1 mEq/L 4.3    Chloride 96 - 112 mEq/L 105    CO2 19 - 32 mEq/L 28    Calcium 8.4 - 10.5 mg/dL 9.6  9.5      Total Protein 6.0 - 8.3 g/dL 7.6    Total Bilirubin 0.2 - 1.2 mg/dL 0.5    Alkaline Phos 39 - 117 U/L 73    AST 0 - 37 U/L 27  20      ALT 0 - 35 U/L 27  20         This result is from an external source.     IMAGING:  none  Current Outpatient Medications on File Prior to Visit  Medication Sig Dispense Refill   rosuvastatin (CRESTOR) 10 MG tablet Take 1 tablet (10 mg total) by mouth daily. 90 tablet 1   alendronate (FOSAMAX) 35 MG tablet PLEASE SEE ATTACHED FOR DETAILED DIRECTIONS (Patient not taking: Reported  on 01/09/2022)     vitamin B-12 (CYANOCOBALAMIN) 1000 MCG tablet Take 1,000 mcg by mouth daily. (Patient not taking: Reported on 01/09/2022)     Vitamin D, Ergocalciferol, (DRISDOL) 1.25 MG (50000 UNIT) CAPS capsule Take 50,000 Units by mouth every 7 (seven) days. (Patient not taking: Reported on 01/09/2022)     No current facility-administered medications on file prior to visit.     Allergies: No Known Allergies  Family History: Family History  Problem Relation Age of Onset   Alzheimer's disease Mother    Sister recently died of brain cancer  Past Medical History: Past Medical History:  Diagnosis Date   Hyperlipidemia    Osteoporosis    Vitamin B12 deficiency    Vitamin D deficiency     Past Surgical History No past surgical history on file.  Social History: Social History   Tobacco Use   Smoking status: Former    Types: Cigarettes   Smokeless tobacco: Never  Substance Use Topics   Alcohol use: Never   Drug use: Never    ROS: Negative for fevers, chills. Positive for headaches. All other systems  reviewed and negative unless stated otherwise in HPI.   Physical Exam:   Vital Signs: BP (!) 99/54   Pulse (!) 51   Ht 5' (1.524 m)   Wt 128 lb 6.4 oz (58.2 kg)   BMI 25.08 kg/m  GENERAL: well appearing,in no acute distress,alert SKIN:  Color, texture, turgor normal. No rashes or lesions HEAD:  Normocephalic/atraumatic. CV:  RRR RESP: Normal respiratory effort MSK: +tenderness to palpation over right scalp. No temporal tenderness  NEUROLOGICAL: Mental Status: Alert, oriented to person, place and time,Follows commands Cranial Nerves: PERRL, visual fields intact to confrontation, extraocular movements intact, facial sensation intact, no facial droop or ptosis, hearing grossly intact, no dysarthria Motor: muscle strength 5/5 both upper and lower extremities Reflexes: 2+ throughout Sensation: intact to light touch all 4 extremities Coordination: Finger-to- nose-finger intact bilaterally Gait: normal-based   IMPRESSION: 61 year old female with a history of HLD, B12 deficiency who presents for evaluation of right scalp pain. Her neurological exam today is normal. PCP has ordered [facility], will review this once it is completed. No temporal tenderness or vision changes to suggest GCA. Suspect she may have occipital neuralgia on the right side. Discussed treatment options, which she declined at this time as she feels her current pain level is manageable.  PLAN: -CTH ordered, will review once this is completed -Next steps: consider PRN muscle relaxer, gabapentin   I spent a total of 20 minutes chart reviewing and counseling the patient. Headache education was done. Discussed treatment options including preventive and acute medications.   Follow-up: PRN   Ocie Doyne, MD 02/14/2022   11:01 AM

## 2022-02-14 ENCOUNTER — Ambulatory Visit: Payer: Federal, State, Local not specified - PPO | Admitting: Psychiatry

## 2022-02-14 ENCOUNTER — Encounter: Payer: Self-pay | Admitting: Psychiatry

## 2022-02-14 VITALS — BP 99/54 | HR 51 | Ht 60.0 in | Wt 128.4 lb

## 2022-02-14 DIAGNOSIS — R202 Paresthesia of skin: Secondary | ICD-10-CM

## 2022-02-14 DIAGNOSIS — R519 Headache, unspecified: Secondary | ICD-10-CM | POA: Diagnosis not present

## 2022-03-11 ENCOUNTER — Ambulatory Visit (INDEPENDENT_AMBULATORY_CARE_PROVIDER_SITE_OTHER): Payer: Federal, State, Local not specified - PPO | Admitting: Internal Medicine

## 2022-03-11 DIAGNOSIS — R52 Pain, unspecified: Secondary | ICD-10-CM | POA: Diagnosis not present

## 2022-03-11 DIAGNOSIS — J069 Acute upper respiratory infection, unspecified: Secondary | ICD-10-CM | POA: Diagnosis not present

## 2022-03-11 DIAGNOSIS — R059 Cough, unspecified: Secondary | ICD-10-CM

## 2022-03-11 LAB — POCT INFLUENZA A/B
Influenza A, POC: NEGATIVE
Influenza B, POC: NEGATIVE

## 2022-03-11 LAB — POC COVID19 BINAXNOW: SARS Coronavirus 2 Ag: NEGATIVE

## 2022-03-11 NOTE — Progress Notes (Signed)
No charge visit.  Cathy Whelpley Hernandez, MD [facility]  

## 2022-03-31 ENCOUNTER — Other Ambulatory Visit: Payer: Self-pay | Admitting: Internal Medicine

## 2022-03-31 DIAGNOSIS — E782 Mixed hyperlipidemia: Secondary | ICD-10-CM

## 2022-03-31 MED ORDER — ATORVASTATIN CALCIUM 40 MG PO TABS: 40.0000 mg | ORAL_TABLET | Freq: Every day | ORAL | 1 refills | Status: DC

## 2022-06-04 ENCOUNTER — Other Ambulatory Visit: Payer: Self-pay | Admitting: Internal Medicine

## 2022-06-04 DIAGNOSIS — R200 Anesthesia of skin: Secondary | ICD-10-CM

## 2022-06-16 ENCOUNTER — Other Ambulatory Visit (HOSPITAL_COMMUNITY)
Admission: RE | Admit: 2022-06-16 | Discharge: 2022-06-16 | Disposition: A | Discharge status: Home / Self Care | Payer: Federal, State, Local not specified - PPO | Source: Ambulatory Visit | Attending: Family Medicine | Admitting: Family Medicine

## 2022-06-16 ENCOUNTER — Ambulatory Visit: Payer: Federal, State, Local not specified - PPO | Admitting: Family Medicine

## 2022-06-16 ENCOUNTER — Encounter: Payer: Self-pay | Admitting: Family Medicine

## 2022-06-16 VITALS — BP 123/79 | HR 76 | Wt 128.7 lb

## 2022-06-16 DIAGNOSIS — Z01419 Encounter for gynecological examination (general) (routine) without abnormal findings: Secondary | ICD-10-CM | POA: Diagnosis not present

## 2022-06-16 DIAGNOSIS — Z124 Encounter for screening for malignant neoplasm of cervix: Secondary | ICD-10-CM | POA: Diagnosis not present

## 2022-06-16 DIAGNOSIS — R102 Pelvic and perineal pain: Secondary | ICD-10-CM

## 2022-06-16 LAB — POCT URINALYSIS DIP (DEVICE)
Bilirubin Urine: NEGATIVE
Glucose, UA: NEGATIVE mg/dL
Hgb urine dipstick: NEGATIVE
Ketones, ur: NEGATIVE mg/dL
Leukocytes,Ua: NEGATIVE
Nitrite: NEGATIVE
Protein, ur: NEGATIVE mg/dL
Specific Gravity, Urine: >=1.03 (ref 1.005–1.030)
Urobilinogen, UA: 0.2 mg/dL (ref 0.0–1.0)
pH: 5.5 (ref 5.0–8.0)

## 2022-06-16 NOTE — Assessment & Plan Note (Addendum)
25366 - Check u/s. Nothing obvious on exam.

## 2022-06-16 NOTE — Progress Notes (Signed)
Subjective:     Cathy Camacho is a 62 y.o. female and is here for a comprehensive physical exam. The patient reports problems - pelvic pain . Pelvic pain x 3 months, suprapubic and rotates to the left. Pain is sharp and intermittent. Denies vaginal discharge. No bleeding. LMP 10 years ago. Frequent urination, started 4 years ago and progressively gotten worse. No hematuria, no dysuria, no changes to BMs, no N/V.   The following portions of the patient's history were reviewed and updated as appropriate: allergies, current medications, past family history, past medical history, past social history, past surgical history, and problem list.  Review of Systems Pertinent items noted in HPI and remainder of comprehensive ROS otherwise negative.   Objective:  Chaperone present for exam   BP 123/79   Pulse 76   Wt 128 lb 11.2 oz (58.4 kg)   BMI 25.13 kg/m  General appearance: alert, cooperative, and appears stated age Neck: no adenopathy, supple, symmetrical, trachea midline, and thyroid not enlarged, symmetric, no tenderness/mass/nodules Lungs: clear to auscultation bilaterally Heart: regular rate and rhythm, S1, S2 normal, no murmur, click, rub or gallop Abdomen: soft, non-tender; bowel sounds normal; no masses,  no organomegaly Pelvic: cervix normal in appearance, external genitalia normal, no adnexal masses or tenderness, no cervical motion tenderness, uterus normal size, shape, and consistency, and vagina normal without discharge Extremities: extremities normal, atraumatic, no cyanosis or edema Pulses: 2+ and symmetric Skin: Skin color, texture, turgor normal. No rashes or lesions Lymph nodes: Cervical, supraclavicular, and axillary nodes normal. Neurologic: Grossly normal    Assessment:    Healthy female exam.      Plan:   Problem List Items Addressed This Visit       Unprioritized   Pelvic pain    99203 - Check u/s. Nothing obvious on exam.      Relevant Orders    POCT urinalysis dip (device) (Completed)   US PELVIC COMPLETE WITH TRANSVAGINAL   Other Visit Diagnoses     Screening for malignant neoplasm of cervix    -  Primary   [phone]   Relevant Orders   Cytology - PAP( [place])   Encounter for gynecological examination without abnormal finding       [phone] - mammogram due this month         See After Visit Summary for Counseling Recommendations

## 2022-06-19 LAB — CYTOLOGY - PAP
Comment: NEGATIVE
Diagnosis: NEGATIVE
High risk HPV: NEGATIVE

## 2022-06-20 ENCOUNTER — Ambulatory Visit (HOSPITAL_COMMUNITY)
Admission: RE | Admit: 2022-06-20 | Discharge: 2022-06-20 | Disposition: A | Discharge status: Home / Self Care | Payer: Federal, State, Local not specified - PPO | Source: Ambulatory Visit | Attending: Family Medicine | Admitting: Family Medicine

## 2022-06-20 DIAGNOSIS — R102 Pelvic and perineal pain: Secondary | ICD-10-CM | POA: Diagnosis not present

## 2022-06-24 ENCOUNTER — Ambulatory Visit: Payer: Federal, State, Local not specified - PPO | Admitting: Podiatry

## 2022-06-24 ENCOUNTER — Ambulatory Visit (INDEPENDENT_AMBULATORY_CARE_PROVIDER_SITE_OTHER): Payer: Federal, State, Local not specified - PPO

## 2022-06-24 DIAGNOSIS — M778 Other enthesopathies, not elsewhere classified: Secondary | ICD-10-CM | POA: Diagnosis not present

## 2022-06-24 DIAGNOSIS — D3610 Benign neoplasm of peripheral nerves and autonomic nervous system, unspecified: Secondary | ICD-10-CM

## 2022-06-24 DIAGNOSIS — M7750 Other enthesopathy of unspecified foot: Secondary | ICD-10-CM

## 2022-06-24 MED ORDER — MELOXICAM 7.5 MG PO TABS: 7.5000 mg | ORAL_TABLET | Freq: Every day | ORAL | 0 refills | Status: AC | PRN

## 2022-06-24 NOTE — Progress Notes (Signed)
Subjective:   Patient ID: Cathy Camacho, female   DOB: 62 y.o.   MRN: [id number]   HPI Chief Complaint  Patient presents with   Foot Pain    Rm 11 Left foot pain on top and medial side of foot. Pt also complains of numbness and tingling feeling in her left 4th toe x 2 months.     62 year old female presents the office for above concerns.  She feels like the toe is not attached, like it is broken but it is not. She has sensation when touching it but has some numbness. This is getting better but she has started to get pain running up the foot and the side. She states she was really depressed after her sister died and and was not active for some time then started going to the gym and being active and it started to bother her. No treatment. No swelling.    ROS  Past Medical History:  Diagnosis Date   Hyperlipidemia    Osteoporosis    Vitamin B12 deficiency    Vitamin D deficiency     Past Surgical History:  Procedure Laterality Date   APPENDECTOMY       Current Outpatient Medications:    alendronate (FOSAMAX) 35 MG tablet, PLEASE SEE ATTACHED FOR DETAILED DIRECTIONS (Patient not taking: Reported on 01/09/2022), Disp: , Rfl:    atorvastatin (LIPITOR) 40 MG tablet, Take 1 tablet (40 mg total) by mouth daily., Disp: 90 tablet, Rfl: 1   vitamin B-12 (CYANOCOBALAMIN) 1000 MCG tablet, Take 1,000 mcg by mouth daily. (Patient not taking: Reported on 01/09/2022), Disp: , Rfl:    Vitamin D, Ergocalciferol, (DRISDOL) 1.25 MG (50000 UNIT) CAPS capsule, Take 50,000 Units by mouth every 7 (seven) days. (Patient not taking: Reported on 01/09/2022), Disp: , Rfl:   No Known Allergies        Objective:  Physical Exam  General: AAO x3, NAD  Dermatological: Skin is warm, dry and supple bilateral.  There are no open sores, no preulcerative lesions, no rash or signs of infection present.  Vascular: Dorsalis Pedis artery and Posterior Tibial artery pedal pulses are 2/4 bilateral with  immedate capillary fill time. There is no pain with calf compression, swelling, warmth, erythema.   Neruologic: Grossly intact via light touch bilateral.  Negative Tinel sign.  Musculoskeletal: Small clicking sensation along the third interspace of the left foot consistent with a neuroma.  Subjectively she has been tenderness on the medial aspect of the foot with walking but no pain on exam.  There is no area of pinpoint tenderness.  No edema or erythema.  Flexor, extensor tendons intact.  MMT 5/5.  Gait: Unassisted, Nonantalgic.       Assessment:   Neuroma, Capsulitis     Plan:  -Treatment options discussed including all alternatives, risks, and complications -Etiology of symptoms were discussed -X-rays were obtained and reviewed with the patient.  3 views of the foot were obtained.  There is no evidence of acute fracture noted.  Increased talonavicular joint uncoverage on the AP view. -I do think her symptoms are more consistent with a neuroma.  Discussed offloading.  Discussed using good arch support will take pressure off of her foot to help with her foot pain in general. -Prescribed mobic. Discussed side effects of the medication and directed to stop if any are to occur and call the office.  -If needed consider steroid injection. -If no improvement consider advanced imaging.  Vivi Barrack DPM

## 2022-06-24 NOTE — Patient Instructions (Signed)
If was nice to meet you today. If you have any questions or any further concerns, please feel fee to give me a call. You can call our office at [phone] or please feel fee to send me a message through MyChart.   --  For inserts I like Powersteps, superfeet, aetrex  ---  Gwenevere Abbot Neuralgia  Morton neuralgia is foot pain that affects the ball of the foot and the area near the toes. Morton neuralgia occurs when part of a nerve in the foot (digital nerve) is under too much pressure (compressed). When this happens over a long period of time, the nerve can thicken (neuroma) and cause pain. Pain usually occurs between the third and fourth toes.  Morton neuralgia can come and go but may get worse over time. What are the causes? This condition is caused by doing the same things over and over with your foot, such as: Activities such as running or jumping. Wearing shoes that are too tight. What increases the risk? You may be at higher risk for Morton neuralgia if you: Are female. Wear high heels. Wear shoes that are narrow or tight. Do activities that repeatedly stretch your toes, such as: Running. Ballet. Long-distance walking. What are the signs or symptoms? The first symptom of Morton neuralgia is pain that spreads from the ball of the foot to the toes. It may feel like you are walking on a marble. Pain usually gets worse with walking and goes away at night. Other symptoms may include numbness and cramping of your toes. Both feet are equally affected, but rarely at the same time. How is this diagnosed? This condition is diagnosed based on your symptoms, your medical history, and a physical exam. Your health care provider may: Squeeze your foot just behind your toe. Ask you to move your toes to check for pain. Ask about your physical activity level. You also may have imaging tests, such as an X-ray, ultrasound, or MRI. How is this treated? Treatment depends on how severe your condition is  and what causes it. Treatment may involve: Wearing different shoes that are not too tight, are low-heeled, and provide good support. For some people, this is the only treatment needed. Wearing an over-the-counter or custom supportive pad (orthotic) under the front of your foot. Getting injections of numbing medicine and anti-inflammatory medicine (steroid) in the nerve. Having surgery to remove part of the thickened nerve. Follow these instructions at home: Managing pain, stiffness, and swelling  Massage your foot as needed. Wear orthotics as told by your health care provider. If directed, put ice on the painful area. To do this: Put ice in a plastic bag. Place a towel between your skin and the bag. Leave the ice on for 20 minutes, 2-3 times a day. Remove the ice if your skin turns bright red. This is very important. If you cannot feel pain, heat, or cold, you have a greater risk of damage to the area. Raise (elevate) the injured area above the level of your heart while you are sitting or lying down. Avoid activities that cause pain or make pain worse. If you play sports, ask your health care provider when it is safe for you to return to sports. General instructions Take over-the-counter and prescription medicines only as told by your health care provider. For the time period you were told by your health care provider, do not drive or use machinery. Wear shoes that: Have soft soles. Have a wide toe area. Provide arch support. Do  not pinch or squeeze your feet. Have room for your orthotics, if this applies. Keep all follow-up visits. This is important. Contact a health care provider if: Your symptoms get worse or do not get better with treatment and home care. Summary Morton neuralgia is foot pain that affects the ball of the foot and the area near the toes. Pain usually occurs between the third and fourth toes, gets worse with walking, and goes away at night. Morton neuralgia occurs  when part of a nerve in the foot (digital nerve) is under too much pressure. When this happens over a long period of time, the nerve can thicken (neuroma) and cause pain. This condition is caused by doing the same things over and over with your foot, such as running or jumping, wearing shoes that are too tight, or wearing high heels. Treatment may involve wearing low-heeled shoes that are not too tight, wearing a supportive pad (orthotic) under the front of your foot, getting injections in the nerve, or having surgery to remove part of the thickened nerve. This information is not intended to replace advice given to you by your health care provider. Make sure you discuss any questions you have with your health care provider. Document Revised: 07/20/2020 Document Reviewed: 07/20/2020 Elsevier Patient Education  2023 [employer].

## 2022-07-04 ENCOUNTER — Inpatient Hospital Stay: Admission: RE | Admit: 2022-07-04 | Payer: Federal, State, Local not specified - PPO | Source: Ambulatory Visit

## 2022-12-24 ENCOUNTER — Other Ambulatory Visit: Payer: Self-pay | Admitting: Internal Medicine

## 2022-12-24 DIAGNOSIS — E782 Mixed hyperlipidemia: Secondary | ICD-10-CM

## 2023-01-14 ENCOUNTER — Ambulatory Visit: Payer: Federal, State, Local not specified - PPO | Admitting: Internal Medicine

## 2023-01-14 ENCOUNTER — Encounter: Payer: Self-pay | Admitting: Internal Medicine

## 2023-01-14 VITALS — BP 110/80 | HR 60 | Temp 98.2°F | Wt 128.2 lb

## 2023-01-14 DIAGNOSIS — H9203 Otalgia, bilateral: Secondary | ICD-10-CM

## 2023-01-14 NOTE — Progress Notes (Signed)
     Established Patient Office Visit     CC/Reason for Visit: Bilateral ear pain  HPI: Cathy Camacho is a 62 y.o. female who is coming in today for the above mentioned reasons for the past 3 days has been having progressive bilateral ear pain as well as pain in her frontal sinuses.  Mild URI symptoms.   Past Medical/Surgical History: Past Medical History:  Diagnosis Date   Hyperlipidemia    Osteoporosis    Vitamin B12 deficiency    Vitamin D deficiency     Past Surgical History:  Procedure Laterality Date   APPENDECTOMY      Social History:  reports that she has quit smoking. Her smoking use included cigarettes. She has never used smokeless tobacco. She reports that she does not drink alcohol and does not use drugs.  Allergies: No Known Allergies  Family History:  Family History  Problem Relation Age of Onset   Alzheimer's disease Mother    COPD Father    Brain cancer Sister      Current Outpatient Medications:    alendronate (FOSAMAX) 35 MG tablet, , Disp: , Rfl:    atorvastatin (LIPITOR) 40 MG tablet, TAKE 1 TABLET BY MOUTH EVERY DAY, Disp: 90 tablet, Rfl: 0   meloxicam (MOBIC) 7.5 MG tablet, Take 1 tablet (7.5 mg total) by mouth daily as needed for pain., Disp: 30 tablet, Rfl: 0   vitamin B-12 (CYANOCOBALAMIN) 1000 MCG tablet, Take 1,000 mcg by mouth daily., Disp: , Rfl:    Vitamin D, Ergocalciferol, (DRISDOL) 1.25 MG (50000 UNIT) CAPS capsule, Take 50,000 Units by mouth every 7 (seven) days., Disp: , Rfl:   Review of Systems:  Negative unless indicated in HPI.   Physical Exam: Vitals:   01/14/23 0824  BP: 110/80  Pulse: 60  Temp: 98.2 F (36.8 C)  TempSrc: Oral  SpO2: 99%  Weight: 128 lb 3.2 oz (58.2 kg)    Body mass index is 25.04 kg/m.   Physical Exam Vitals reviewed.  Constitutional:      Appearance: Normal appearance.  HENT:     Right Ear: Ear canal and external ear normal. A middle ear effusion is present.     Left Ear: Ear  canal and external ear normal. A middle ear effusion is present.     Mouth/Throat:     Mouth: Mucous membranes are moist.     Pharynx: Oropharynx is clear.  Eyes:     Conjunctiva/sclera: Conjunctivae normal.     Pupils: Pupils are equal, round, and reactive to light.  Cardiovascular:     Rate and Rhythm: Normal rate and regular rhythm.  Pulmonary:     Effort: Pulmonary effort is normal.     Breath sounds: Normal breath sounds.  Neurological:     Mental Status: She is alert.      Impression and Plan:  Acute ear pain, bilateral   -Has bilateral middle ear effusions but no signs of infection. -Given exam findings, PNA, pharyngitis, ear infection are not likely, hence abx have not been prescribed. -Have advised rest, fluids, OTC antihistamines, cough suppressants and mucinex. -RTC if no improvement in 10-14 days.   Time spent:22 minutes reviewing chart, interviewing and examining patient and formulating plan of care.     Chaya Jan, MD [facility]

## 2023-03-23 ENCOUNTER — Other Ambulatory Visit: Payer: Self-pay | Admitting: Nurse Practitioner

## 2023-03-23 DIAGNOSIS — R1310 Dysphagia, unspecified: Secondary | ICD-10-CM | POA: Diagnosis not present

## 2023-03-23 DIAGNOSIS — R1013 Epigastric pain: Secondary | ICD-10-CM

## 2023-04-10 ENCOUNTER — Other Ambulatory Visit: Payer: Self-pay | Admitting: Internal Medicine

## 2023-04-10 DIAGNOSIS — E782 Mixed hyperlipidemia: Secondary | ICD-10-CM

## 2023-04-23 DIAGNOSIS — K635 Polyp of colon: Secondary | ICD-10-CM | POA: Diagnosis not present

## 2023-04-23 DIAGNOSIS — K648 Other hemorrhoids: Secondary | ICD-10-CM | POA: Diagnosis not present

## 2023-04-23 DIAGNOSIS — K222 Esophageal obstruction: Secondary | ICD-10-CM | POA: Diagnosis not present

## 2023-04-23 DIAGNOSIS — R1013 Epigastric pain: Secondary | ICD-10-CM | POA: Diagnosis not present

## 2023-04-23 DIAGNOSIS — D126 Benign neoplasm of colon, unspecified: Secondary | ICD-10-CM | POA: Diagnosis not present

## 2023-04-23 DIAGNOSIS — K229 Disease of esophagus, unspecified: Secondary | ICD-10-CM | POA: Diagnosis not present

## 2023-04-23 DIAGNOSIS — Z1211 Encounter for screening for malignant neoplasm of colon: Secondary | ICD-10-CM | POA: Diagnosis not present

## 2023-04-23 DIAGNOSIS — K2930 Chronic superficial gastritis without bleeding: Secondary | ICD-10-CM | POA: Diagnosis not present

## 2023-04-23 DIAGNOSIS — R1310 Dysphagia, unspecified: Secondary | ICD-10-CM | POA: Diagnosis not present

## 2023-04-23 DIAGNOSIS — D125 Benign neoplasm of sigmoid colon: Secondary | ICD-10-CM | POA: Diagnosis not present

## 2023-04-23 DIAGNOSIS — K449 Diaphragmatic hernia without obstruction or gangrene: Secondary | ICD-10-CM | POA: Diagnosis not present

## 2023-04-23 LAB — HM COLONOSCOPY

## 2023-05-12 ENCOUNTER — Other Ambulatory Visit: Payer: Self-pay | Admitting: Internal Medicine

## 2023-05-12 DIAGNOSIS — E782 Mixed hyperlipidemia: Secondary | ICD-10-CM

## 2023-07-21 ENCOUNTER — Other Ambulatory Visit: Payer: Self-pay | Admitting: Internal Medicine

## 2023-07-21 DIAGNOSIS — M62838 Other muscle spasm: Secondary | ICD-10-CM

## 2023-07-21 MED ORDER — CYCLOBENZAPRINE HCL 5 MG PO TABS: 5.0000 mg | ORAL_TABLET | Freq: Every evening | ORAL | 0 refills | Status: DC | PRN

## 2023-08-18 ENCOUNTER — Other Ambulatory Visit: Payer: Self-pay | Admitting: Internal Medicine

## 2023-08-18 DIAGNOSIS — M62838 Other muscle spasm: Secondary | ICD-10-CM

## 2023-08-18 DIAGNOSIS — M542 Cervicalgia: Secondary | ICD-10-CM

## 2023-08-21 ENCOUNTER — Ambulatory Visit: Attending: Internal Medicine | Admitting: Physical Therapy

## 2023-08-21 ENCOUNTER — Encounter: Payer: Self-pay | Admitting: Physical Therapy

## 2023-08-21 ENCOUNTER — Other Ambulatory Visit: Payer: Self-pay

## 2023-08-21 DIAGNOSIS — R252 Cramp and spasm: Secondary | ICD-10-CM | POA: Diagnosis not present

## 2023-08-21 DIAGNOSIS — R293 Abnormal posture: Secondary | ICD-10-CM | POA: Diagnosis not present

## 2023-08-21 DIAGNOSIS — M542 Cervicalgia: Secondary | ICD-10-CM | POA: Diagnosis not present

## 2023-08-21 NOTE — Therapy (Signed)
 OUTPATIENT PHYSICAL THERAPY CERVICAL EVALUATION   Patient Name: Cathy Camacho MRN: [id number] DOB:[birth year], 63 y.o., female Today's Date: 08/21/2023  END OF SESSION:  PT End of Session - 08/21/23 1231     Visit Number 1    Date for PT Re-Evaluation 10/16/23    Authorization Type [employer] no auth req'd    PT Start Time 0800    PT Stop Time 0845    PT Time Calculation (min) 45 min    Activity Tolerance Patient tolerated treatment well    Behavior During Therapy [facility] for tasks assessed/performed          Past Medical History:  Diagnosis Date   Hyperlipidemia    Osteoporosis    Vitamin B12 deficiency    Vitamin D  deficiency    Past Surgical History:  Procedure Laterality Date   APPENDECTOMY     Patient Active Problem List   Diagnosis Date Noted   Pelvic pain 06/16/2022   Hyperlipidemia 10/19/2020   Vitamin D  deficiency 10/19/2020   Vitamin B12 deficiency 10/19/2020   Osteoporosis 10/19/2020   Globus sensation 09/20/2015   Hoarseness 09/20/2015   Laryngopharyngeal reflux (LPR) 09/20/2015    PCP: Theophilus Andrews, Tully GRADE, MD  REFERRING PROVIDER: Theophilus Andrews, Tully GRADE, MD  REFERRING DIAG: M54.2 (ICD-10-CM) - Cervicalgia  THERAPY DIAG:  Cramp and spasm  Cervicalgia  Abnormal posture  Rationale for Evaluation and Treatment: Rehabilitation  ONSET DATE: 1 month  SUBJECTIVE:                                                                                                                                                                                                         SUBJECTIVE STATEMENT: 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  About a month ago I took a nap for about 20 min and woke up with lots of pain.  I took a trip and it's still there.  I have a headache in the back of my head, my neck is so stiff and I can't turn either way.  Tylenol helps but when it wears off I can really tell.  It feels too hard to hold my head up.  I am not  sleeping well but I got a better contoured pillow which helps my neck.  Hand dominance: Right  PERTINENT HISTORY:  osteoporosis  PAIN:  PAIN:  Are you having pain? Yes NPRS scale: 7/10 Pain location: back of head, posterior neck to shoulders bil, into intrascapular region Pain orientation: Bilateral  PAIN TYPE: aching, dull, sharp, and tight Pain  description: constant  Aggravating factors: holding head up, turning head left and right, trying to sleep Relieving factors: Tylenol, muscle relaxers   PRECAUTIONS: None  RED FLAGS: None     WEIGHT BEARING RESTRICTIONS: No  FALLS:  Has patient fallen in last 6 months? 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  DIAGNOSTIC FINDINGS:  None at this time  PATIENT SURVEYS:  NDI: 17/50  Minimum Detectable Change (90% confidence): 5 points or 10% points  COGNITION: Overall cognitive status: Within functional limits for tasks assessed  SENSATION: WFL  POSTURE: carrying head in forward flexed position secondary to pain  PALPATION: Signif spasm present Rt>Lt SO, cervical paraspinals, upper trap, deep multifii   CERVICAL ROM:   Active ROM A/PROM (deg) eval  Flexion 30*  Extension 65  Right lateral flexion 20*  Left lateral flexion 25*  Right rotation 50*  Left rotation 30*   (Blank rows = not tested) *pain  UPPER EXTREMITY ROM: WNL without pain - Pt reports using more force with UE is painful to neck and head such as dressing  UPPER EXTREMITY MMT:  5/5 bil UE  CERVICAL SPECIAL TESTS:  Relief with traction    TREATMENT DATE:  08/21/23   SO manual release Manual traction Gr II/III to lower cervical spine Upper thoracic PA from supine with hands under Pt Gr  II/III P/ROM with contract relax into bil Rot Pt education about DN - declines for now doesn't like needles but PT gave handout Pt educaiton: use two tennis balls in sock to do self-release of SO Initiated HEP Work on neutral head position bringing head back into retraction                                                                                                                              PATIENT EDUCATION:  Education details: [email or web address] Person educated: Patient Education method: Programmer, multimedia, Facilities manager, Verbal cues, and Handouts Education comprehension: verbalized understanding and returned demonstration  HOME EXERCISE PROGRAM: Access Code: [email or web address] URL: [email or web address] Date: 08/21/2023 Prepared by: Orvil Nabor Thomann  Exercises - Supine Cervical Retraction with Towel  - 1 x daily - 7 x weekly - 1 sets - 10 reps - 5 hold - Supine Cervical Rotation AROM on Pillow  - 1 x daily - 7 x weekly - 1 sets - 10 reps - 5 hold - Supine Isometric Neck Sidebend  - 1 x daily - 7 x weekly - 1 sets - 10 reps - 5 sec hold  ASSESSMENT:  CLINICAL IMPRESSION: Patient is a 63 y.o. female who was seen today for physical therapy evaluation and treatment for neck pain.  Pain started after awaking from a nap about 1 mo ago when she was in  an awkward position on couch.  Sounds like it started as localized sharp pain consistent with facet syndrome (Pt has cervical arthritis) but has since progressed to signif muscle spasm as well.  Pt holds posture in forward head secondary to pain and has painful limitations into bil neck Rot.  She denies any arm symptoms and does have 5/5 strength throughout UE.  She has significant spasm present in Rt>Lt soft tissues.  She responded very well to manual techniques today as she is not yet sure she wants to pursue DN (info given).  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  She has pain with turning head while driving, sleeping,  and using UE for overhead tasks such as dressing.  She will benefit from skilled PT to reduce pain, spasm and restore proper posture.   OBJECTIVE IMPAIRMENTS: decreased activity tolerance, decreased mobility, decreased ROM, decreased strength, hypomobility, increased muscle spasms, impaired flexibility, impaired tone, impaired UE functional use, improper body mechanics, postural dysfunction, and pain.   ACTIVITY LIMITATIONS: carrying, lifting, sitting, standing, sleeping, bed mobility, bathing, dressing, reach over head, and hygiene/grooming  PARTICIPATION LIMITATIONS: meal prep, cleaning, laundry, driving, shopping, community activity, and occupation  PERSONAL FACTORS: 1 comorbidity: cervical arthritis are also affecting patient's functional outcome.   REHAB POTENTIAL: Excellent  CLINICAL DECISION MAKING: Stable/uncomplicated  EVALUATION COMPLEXITY: Low   GOALS: Goals reviewed with patient? Yes  SHORT TERM GOALS: Target date: 09/18/23  Pt will be ind with initial HEP Baseline:  Goal status: INITIAL  2.  Pt will be able to restore neutral alignment posture of neck/head secondary to reduced spasm and pain Baseline:  Goal status: INITIAL  3.  Pt will be able to turn head with driving with min pain Baseline:  Goal status: INITIAL  4.  Pt will be able to sleep in varied positions with min pain Baseline:  Goal status: INITIAL    LONG TERM GOALS: Target date: 10/16/23  Pt will be ind with advanced HEP and understand importance of compliance for maintenance of gains from PT.  Baseline:  Goal status: INITIAL  2.  NDI score improved to 12/50 or less to demo improved function Baseline: 17/50 Goal status: INITIAL  3.  Pt will be able to don/doff sports bra without neck pain Baseline:  Goal status: INITIAL  4.  Pt will report resolution of posterior headaches so she can more fully concentrate and perform daily tasks. Baseline:  Goal status: INITIAL     PLAN:  PT  FREQUENCY: 1-2x/week  PT DURATION: 8 weeks  PLANNED INTERVENTIONS: 97110-Therapeutic exercises, 97530- Therapeutic activity, [id number]- Neuromuscular re-education, 97535- Self Care, [id number]- Manual therapy, [id number]- Electrical stimulation (unattended), [phone]- Electrical stimulation (manual), [id number]- Traction (mechanical), 20560 (1-2 muscles), 20561 (3+ muscles)- Dry Needling, Patient/Family education, Taping, Joint mobilization, Spinal mobilization, Cryotherapy, and Moist heat  PLAN FOR NEXT SESSION: review HEP, manual techniques to reduce pain and spasm of neck, neck ROM, neck stabilization (Pt given DN info but not sure she will do that - fear of needles)   Psalm Arman, PT 08/21/23 12:33 PM

## 2023-08-21 NOTE — Patient Instructions (Signed)

## 2023-09-03 ENCOUNTER — Encounter: Payer: Self-pay | Admitting: Physical Therapy

## 2023-09-03 ENCOUNTER — Ambulatory Visit: Payer: Self-pay | Attending: Internal Medicine | Admitting: Physical Therapy

## 2023-09-03 DIAGNOSIS — M6281 Muscle weakness (generalized): Secondary | ICD-10-CM | POA: Diagnosis present

## 2023-09-03 DIAGNOSIS — R252 Cramp and spasm: Secondary | ICD-10-CM | POA: Diagnosis not present

## 2023-09-03 DIAGNOSIS — R293 Abnormal posture: Secondary | ICD-10-CM | POA: Insufficient documentation

## 2023-09-03 DIAGNOSIS — M542 Cervicalgia: Secondary | ICD-10-CM | POA: Diagnosis present

## 2023-09-03 NOTE — Therapy (Signed)
 OUTPATIENT PHYSICAL THERAPY CERVICAL TREATMENT   Patient Name: Cathy Camacho MRN: [id number] DOB:November 15, 1960, 63 y.o., female Today's Date: 09/03/2023  END OF SESSION:  PT End of Session - 09/03/23 0900     Visit Number 2    Date for PT Re-Evaluation 10/16/23    Authorization Type BCBS no auth req'd    PT Start Time 0800    PT Stop Time 0845    PT Time Calculation (min) 45 min    Activity Tolerance Patient tolerated treatment well    Behavior During Therapy [facility] for tasks assessed/performed           Past Medical History:  Diagnosis Date   Hyperlipidemia    Osteoporosis    Vitamin B12 deficiency    Vitamin D  deficiency    Past Surgical History:  Procedure Laterality Date   APPENDECTOMY     Patient Active Problem List   Diagnosis Date Noted   Pelvic pain 06/16/2022   Hyperlipidemia 10/19/2020   Vitamin D  deficiency 10/19/2020   Vitamin B12 deficiency 10/19/2020   Osteoporosis 10/19/2020   Globus sensation 09/20/2015   Hoarseness 09/20/2015   Laryngopharyngeal reflux (LPR) 09/20/2015    PCP: Theophilus Andrews, Tully GRADE, MD  REFERRING PROVIDER: Theophilus Andrews, Tully GRADE, MD  REFERRING DIAG: M54.2 (ICD-10-CM) - Cervicalgia  THERAPY DIAG:  Cramp and spasm  Cervicalgia  Abnormal posture  Rationale for Evaluation and Treatment: Rehabilitation  ONSET DATE: 1 month  SUBJECTIVE:                                                                                                                                                                                                         SUBJECTIVE STATEMENT: Patient reports her neck pain is bad today. Pain is 5/10. She has been doing her home exercises and they seem to help. She felt more painful after manual techniques but she feels that is what she needs.   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  About a month ago I took a nap for about 20 min and woke up with lots of pain.  I took a trip  and it's still there.  I have a headache in the back of my head, my neck is so stiff and I can't turn either way.  Tylenol helps but when it wears off I can really tell.  It feels too hard to hold my head up.  I am not sleeping well but I got a better contoured pillow which helps my neck.  Hand dominance: Right  PERTINENT HISTORY:  osteoporosis  PAIN:  PAIN: 09/03/2023 Are you having pain? Yes NPRS scale: 5/10 Pain location: back of head, posterior neck to shoulders bil, into intrascapular region Pain orientation: Bilateral  PAIN TYPE: aching, dull, sharp, and tight Pain description: constant  Aggravating factors: holding head up, turning head left and right, trying to sleep Relieving factors: Tylenol, muscle relaxers   PRECAUTIONS: None  RED FLAGS: None     WEIGHT BEARING RESTRICTIONS: No  FALLS:  Has patient fallen in last 6 months? 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  DIAGNOSTIC FINDINGS:  None at this time  PATIENT SURVEYS:  NDI: 17/50  Minimum Detectable Change (90% confidence): 5 points or 10% points  COGNITION: Overall cognitive status: Within functional limits for tasks assessed  SENSATION: WFL  POSTURE: carrying head in forward flexed position secondary to pain  PALPATION: Signif spasm present Rt>Lt SO, cervical paraspinals, upper trap, deep multifii   CERVICAL ROM:   Active ROM A/PROM (deg) eval  Flexion 30*  Extension 65  Right lateral flexion 20*  Left lateral flexion 25*  Right rotation 50*  Left rotation 30*   (Blank rows = not tested) *pain  UPPER EXTREMITY ROM: WNL without pain - Pt reports using more force with UE is painful to neck and head such as dressing  UPPER EXTREMITY  MMT:  5/5 bil UE  CERVICAL SPECIAL TESTS:  Relief with traction    TREATMENT DATE:  09/03/2023 UBE 3/3 6 total- PT present to discuss status Supine cervical retraction to towel 2 x 10 Supine cervical rotation on pillow x 10 Supine cervical rotation isometric x 10 Cervical Melt method (flexion/extension ; rotation) x 15 each direction Open Books x 10 each side Manual: Suboccipital release and STM to bilateral upper trap     08/21/23   SO manual release Manual traction Gr II/III to lower cervical spine Upper thoracic PA from supine with hands under Pt Gr II/III P/ROM with contract relax into bil Rot Pt education about DN - declines for now doesn't like needles but PT gave handout Pt educaiton: use two tennis balls in sock to do self-release of SO Initiated HEP Work on neutral head position bringing head back into retraction                                                                                                                              PATIENT EDUCATION:  Education details: [email or web address] Person educated: Patient Education method: Programmer, multimedia, Facilities manager, Verbal cues, and Handouts Education comprehension: verbalized understanding and returned demonstration  HOME EXERCISE PROGRAM: Access Code: [email or web address] URL: [email or web address] Date: 09/03/2023 Prepared by: Kristeen Sar  Exercises - Supine Cervical Retraction with Towel  - 1 x daily -  7 x weekly - 1 sets - 10 reps - 5 hold - Supine Cervical Rotation AROM on Pillow  - 1 x daily - 7 x weekly - 1 sets - 10 reps - 5 hold - Supine Isometric Neck Sidebend  - 1 x daily - 7 x weekly - 1 sets - 10 reps - 5 sec hold - Sidelying Open Book Thoracic Rotation with Knee on Foam Roll  - 1 x daily - 7 x weekly - 1 sets - 10 reps - Cat Cow  - 1 x daily - 7 x weekly - 1 sets - 10 reps  ASSESSMENT:  CLINICAL IMPRESSION: Cathy Camacho presents to first follow up appointment since evaluation. She verbalized compliance to HEP  and required minimal verbal cues for form correction. Patient responded well to manual soft tissue techniques and cervical melt method. Introduced thoracic mobility exercises and patient verbalized relief. Updated HEP to include exercises. Patient should progress well with physical therapy.  OBJECTIVE IMPAIRMENTS: decreased activity tolerance, decreased mobility, decreased ROM, decreased strength, hypomobility, increased muscle spasms, impaired flexibility, impaired tone, impaired UE functional use, improper body mechanics, postural dysfunction, and pain.   ACTIVITY LIMITATIONS: carrying, lifting, sitting, standing, sleeping, bed mobility, bathing, dressing, reach over head, and hygiene/grooming  PARTICIPATION LIMITATIONS: meal prep, cleaning, laundry, driving, shopping, community activity, and occupation  PERSONAL FACTORS: 1 comorbidity: cervical arthritis are also affecting patient's functional outcome.   REHAB POTENTIAL: Excellent  CLINICAL DECISION MAKING: Stable/uncomplicated  EVALUATION COMPLEXITY: Low   GOALS: Goals reviewed with patient? Yes  SHORT TERM GOALS: Target date: 09/18/23  Pt will be ind with initial HEP Baseline:  Goal status: INITIAL  2.  Pt will be able to restore neutral alignment posture of neck/head secondary to reduced spasm and pain Baseline:  Goal status: INITIAL  3.  Pt will be able to turn head with driving with min pain Baseline:  Goal status: INITIAL  4.  Pt will be able to sleep in varied positions with min pain Baseline:  Goal status: INITIAL    LONG TERM GOALS: Target date: 10/16/23  Pt will be ind with advanced HEP and understand importance of compliance for maintenance of gains from PT.  Baseline:  Goal status: INITIAL  2.  NDI score improved to 12/50 or less to demo improved function Baseline: 17/50 Goal status: INITIAL  3.  Pt will be able to don/doff sports bra without neck pain Baseline:  Goal status: INITIAL  4.  Pt will  report resolution of posterior headaches so she can more fully concentrate and perform daily tasks. Baseline:  Goal status: INITIAL     PLAN:  PT FREQUENCY: 1-2x/week  PT DURATION: 8 weeks  PLANNED INTERVENTIONS: 97110-Therapeutic exercises, 97530- Therapeutic activity, [id number]- Neuromuscular re-education, 97535- Self Care, 02859- Manual therapy, [id number]- Electrical stimulation (unattended), [id number]- Electrical stimulation (manual), [id number]- Traction (mechanical), 20560 (1-2 muscles), 20561 (3+ muscles)- Dry Needling, Patient/Family education, Taping, Joint mobilization, Spinal mobilization, Cryotherapy, and Moist heat  PLAN FOR NEXT SESSION: , manual techniques to reduce pain and spasm of neck, neck ROM, neck stabilization (Pt given DN info but not sure she will do that - fear of needles)   Kristeen Sar, PT 09/03/23 9:02 AM

## 2023-09-08 ENCOUNTER — Ambulatory Visit: Payer: Self-pay

## 2023-09-08 DIAGNOSIS — R293 Abnormal posture: Secondary | ICD-10-CM

## 2023-09-08 DIAGNOSIS — M542 Cervicalgia: Secondary | ICD-10-CM

## 2023-09-08 DIAGNOSIS — M6281 Muscle weakness (generalized): Secondary | ICD-10-CM

## 2023-09-08 DIAGNOSIS — R252 Cramp and spasm: Secondary | ICD-10-CM | POA: Diagnosis not present

## 2023-09-08 NOTE — Therapy (Signed)
 OUTPATIENT PHYSICAL THERAPY CERVICAL TREATMENT   Patient Name: Cathy Camacho MRN: [id number] DOB:[birth year], 63 y.o., female Today's Date: 09/08/2023  END OF SESSION:  PT End of Session - 09/08/23 0801     Visit Number 3    Date for PT Re-Evaluation 10/16/23    Authorization Type BCBS no auth req'd    PT Start Time 0801    PT Stop Time 0843    PT Time Calculation (min) 42 min    Activity Tolerance Patient tolerated treatment well    Behavior During Therapy [facility] for tasks assessed/performed           Past Medical History:  Diagnosis Date   Hyperlipidemia    Osteoporosis    Vitamin B12 deficiency    Vitamin D  deficiency    Past Surgical History:  Procedure Laterality Date   APPENDECTOMY     Patient Active Problem List   Diagnosis Date Noted   Pelvic pain 06/16/2022   Hyperlipidemia 10/19/2020   Vitamin D  deficiency 10/19/2020   Vitamin B12 deficiency 10/19/2020   Osteoporosis 10/19/2020   Globus sensation 09/20/2015   Hoarseness 09/20/2015   Laryngopharyngeal reflux (LPR) 09/20/2015    PCP: Theophilus Andrews, Tully GRADE, MD  REFERRING PROVIDER: Theophilus Andrews, Tully GRADE, MD  REFERRING DIAG: M54.2 (ICD-10-CM) - Cervicalgia  THERAPY DIAG:  Cramp and spasm  Cervicalgia  Abnormal posture  Muscle weakness (generalized)  Rationale for Evaluation and Treatment: Rehabilitation  ONSET DATE: 1 month  SUBJECTIVE:                                                                                                                                                                                                         SUBJECTIVE STATEMENT: Patient reports she is feeling a little better.  She states the inflammation feels like its improving.  Pain reported at 4/10.     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  About a month ago I took a nap for about 20 min and woke up with lots of pain.  I took a trip and it's still there.  I have a headache  in the back of my head, my neck is so stiff and I can't turn either way.  Tylenol helps but when it wears off I can really tell.  It feels too hard to hold my head up.  I am not sleeping well but I got a better contoured pillow which helps my neck.  Hand dominance: Right  PERTINENT HISTORY:  osteoporosis  PAIN:  PAIN: 09/03/2023  Are you having pain? Yes NPRS scale: 5/10 Pain location: back of head, posterior neck to shoulders bil, into intrascapular region Pain orientation: Bilateral  PAIN TYPE: aching, dull, sharp, and tight Pain description: constant  Aggravating factors: holding head up, turning head left and right, trying to sleep Relieving factors: Tylenol, muscle relaxers   PRECAUTIONS: None  RED FLAGS: None     WEIGHT BEARING RESTRICTIONS: No  FALLS:  Has patient fallen in last 6 months? 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  DIAGNOSTIC FINDINGS:  None at this time  PATIENT SURVEYS:  NDI: 17/50  Minimum Detectable Change (90% confidence): 5 points or 10% points  COGNITION: Overall cognitive status: Within functional limits for tasks assessed  SENSATION: WFL  POSTURE: carrying head in forward flexed position secondary to pain  PALPATION: Signif spasm present Rt>Lt SO, cervical paraspinals, upper trap, deep multifii   CERVICAL ROM:   Active ROM A/PROM (deg) eval  Flexion 30*  Extension 65  Right lateral flexion 20*  Left lateral flexion 25*  Right rotation 50*  Left rotation 30*   (Blank rows = not tested) *pain  UPPER EXTREMITY ROM: WNL without pain - Pt reports using more force with UE is painful to neck and head such as dressing  UPPER EXTREMITY MMT:  5/5 bil UE  CERVICAL SPECIAL  TESTS:  Relief with traction    TREATMENT DATE:  09/08/2023 UBE 3/3 6 total- PT present to discuss status 3 way scapular stabilization with blue loop x 5 each side 4 D ball rolls with 2 lb (light blue) plyo ball x 20 each direction on each arm Tband shoulder extension, rows, bilateral ER and bilateral shoulder horizontal abduction 2 x 10 each with red band with handles Supine cervical retraction to towel 2 x 10 Cervical Melt method (flexion/extension ; rotation) x 20 each direction Open Books x 10 each side Manual: Suboccipital release and STM to bilateral upper trap  09/03/2023 UBE 3/3 6 total- PT present to discuss status Supine cervical retraction to towel 2 x 10 Supine cervical rotation on pillow x 10 Supine cervical rotation isometric x 10 Cervical Melt method (flexion/extension ; rotation) x 15 each direction Open Books x 10 each side Manual: Suboccipital release and STM to bilateral upper trap  08/21/23   SO manual release Manual traction Gr II/III to lower cervical spine Upper thoracic PA from supine with hands under Pt Gr II/III P/ROM with contract relax into bil Rot Pt education about DN - declines for now doesn't like needles but PT gave handout Pt educaiton: use two tennis balls in sock to do self-release of SO Initiated HEP Work on neutral head position bringing head back into retraction  PATIENT EDUCATION:  Education details: [id number] Person educated: Patient Education method: Explanation, Demonstration, Verbal cues, and Handouts Education comprehension: verbalized understanding and returned demonstration  HOME EXERCISE PROGRAM: Access Code: [email or web address] URL: [email or web address] Date: 09/03/2023 Prepared by: Kristeen Sar  Exercises - Supine Cervical Retraction with Towel  - 1 x daily - 7 x weekly - 1 sets - 10 reps - 5  hold - Supine Cervical Rotation AROM on Pillow  - 1 x daily - 7 x weekly - 1 sets - 10 reps - 5 hold - Supine Isometric Neck Sidebend  - 1 x daily - 7 x weekly - 1 sets - 10 reps - 5 sec hold - Sidelying Open Book Thoracic Rotation with Knee on Foam Roll  - 1 x daily - 7 x weekly - 1 sets - 10 reps - Cat Cow  - 1 x daily - 7 x weekly - 1 sets - 10 reps  ASSESSMENT:  CLINICAL IMPRESSION: Lillion is progressing appropriately.  She is reporting consistent decline in pain.  She was able to tolerate addition of postural exercises.  She appears to be compliant with her HEP and well motivated.  She should continue to improve.  She would benefit from continued skilled PT for postural strengthening, C spine ROM and stretching, along with manual techniques to address goals below.    OBJECTIVE IMPAIRMENTS: decreased activity tolerance, decreased mobility, decreased ROM, decreased strength, hypomobility, increased muscle spasms, impaired flexibility, impaired tone, impaired UE functional use, improper body mechanics, postural dysfunction, and pain.   ACTIVITY LIMITATIONS: carrying, lifting, sitting, standing, sleeping, bed mobility, bathing, dressing, reach over head, and hygiene/grooming  PARTICIPATION LIMITATIONS: meal prep, cleaning, laundry, driving, shopping, community activity, and occupation  PERSONAL FACTORS: 1 comorbidity: cervical arthritis are also affecting patient's functional outcome.   REHAB POTENTIAL: Excellent  CLINICAL DECISION MAKING: Stable/uncomplicated  EVALUATION COMPLEXITY: Low   GOALS: Goals reviewed with patient? Yes  SHORT TERM GOALS: Target date: 09/18/23  Pt will be ind with initial HEP Baseline:  Goal status: MET 09/08/23  2.  Pt will be able to restore neutral alignment posture of neck/head secondary to reduced spasm and pain Baseline:  Goal status: INITIAL  3.  Pt will be able to turn head with driving with min pain Baseline:  Goal status: INITIAL  4.  Pt  will be able to sleep in varied positions with min pain Baseline:  Goal status: INITIAL    LONG TERM GOALS: Target date: 10/16/23  Pt will be ind with advanced HEP and understand importance of compliance for maintenance of gains from PT.  Baseline:  Goal status: INITIAL  2.  NDI score improved to 12/50 or less to demo improved function Baseline: 17/50 Goal status: INITIAL  3.  Pt will be able to don/doff sports bra without neck pain Baseline:  Goal status: INITIAL  4.  Pt will report resolution of posterior headaches so she can more fully concentrate and perform daily tasks. Baseline:  Goal status: INITIAL     PLAN:  PT FREQUENCY: 1-2x/week  PT DURATION: 8 weeks  PLANNED INTERVENTIONS: 97110-Therapeutic exercises, 97530- Therapeutic activity, [id number]- Neuromuscular re-education, 97535- Self Care, 02859- Manual therapy, G0283- Electrical stimulation (unattended), [phone]- Electrical stimulation (manual), [id number]- Traction (mechanical), 20560 (1-2 muscles), 20561 (3+ muscles)- Dry Needling, Patient/Family education, Taping, Joint mobilization, Spinal mobilization, Cryotherapy, and Moist heat  PLAN FOR NEXT SESSION: Postural strengthening, scapular stabilization, manual techniques to reduce pain and spasm of neck, neck ROM, neck stabilization (Pt given DN info but not  sure she will do that - fear of needles)   Delon B. Hewitt Garner, PT 09/08/23 8:44 AM [facility] [address] [place], [place] [address] Phone # [phone] Fax [phone]

## 2023-09-11 ENCOUNTER — Ambulatory Visit

## 2023-09-11 DIAGNOSIS — R252 Cramp and spasm: Secondary | ICD-10-CM | POA: Diagnosis not present

## 2023-09-11 DIAGNOSIS — R293 Abnormal posture: Secondary | ICD-10-CM | POA: Diagnosis not present

## 2023-09-11 DIAGNOSIS — M542 Cervicalgia: Secondary | ICD-10-CM

## 2023-09-11 DIAGNOSIS — M6281 Muscle weakness (generalized): Secondary | ICD-10-CM

## 2023-09-11 NOTE — Therapy (Signed)
 OUTPATIENT PHYSICAL THERAPY CERVICAL TREATMENT   Patient Name: Cathy Camacho MRN: [id number] DOB:05-05-1960, 63 y.o., female Today's Date: 09/11/2023  END OF SESSION:  PT End of Session - 09/11/23 0809     Visit Number 4    Date for PT Re-Evaluation 10/16/23    Authorization Type BCBS no auth req'd    PT Start Time 0805    PT Stop Time 0845    PT Time Calculation (min) 40 min    Activity Tolerance Patient tolerated treatment well    Behavior During Therapy [facility] for tasks assessed/performed           Past Medical History:  Diagnosis Date   Hyperlipidemia    Osteoporosis    Vitamin B12 deficiency    Vitamin D  deficiency    Past Surgical History:  Procedure Laterality Date   APPENDECTOMY     Patient Active Problem List   Diagnosis Date Noted   Pelvic pain 06/16/2022   Hyperlipidemia 10/19/2020   Vitamin D  deficiency 10/19/2020   Vitamin B12 deficiency 10/19/2020   Osteoporosis 10/19/2020   Globus sensation 09/20/2015   Hoarseness 09/20/2015   Laryngopharyngeal reflux (LPR) 09/20/2015    PCP: Theophilus Andrews, Tully GRADE, MD  REFERRING PROVIDER: Theophilus Andrews, Tully GRADE, MD  REFERRING DIAG: M54.2 (ICD-10-CM) - Cervicalgia  THERAPY DIAG:  Cervicalgia  Abnormal posture  Muscle weakness (generalized)  Cramp and spasm  Rationale for Evaluation and Treatment: Rehabilitation  ONSET DATE: 1 month  SUBJECTIVE:                                                                                                                                                                                                         SUBJECTIVE STATEMENT: Patient reports she continues to feel better.  She explains that she is using a lot of natural remedies like ginger to reduce the inflammation.  She is no longer having to take pain medication.   Pain reported at 3/10.     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  About a month ago I took a nap for about  20 min and woke up with lots of pain.  I took a trip and it's still there.  I have a headache in the back of my head, my neck is so stiff and I can't turn either way.  Tylenol helps but when it wears off I can really tell.  It feels too hard to hold my head up.  I am not sleeping well but I got a better contoured  pillow which helps my neck.  Hand dominance: Right  PERTINENT HISTORY:  osteoporosis  PAIN:  PAIN: 09/11/2023 Are you having pain? Yes NPRS scale: 3/10 Pain location: back of head, posterior neck to shoulders bil, into intrascapular region Pain orientation: Bilateral  PAIN TYPE: aching, dull, sharp, and tight Pain description: constant  Aggravating factors: holding head up, turning head left and right, trying to sleep Relieving factors: Tylenol, muscle relaxers   PRECAUTIONS: None  RED FLAGS: None     WEIGHT BEARING RESTRICTIONS: No  FALLS:  Has patient fallen in last 6 months? 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  DIAGNOSTIC FINDINGS:  None at this time  PATIENT SURVEYS:  NDI: 17/50  Minimum Detectable Change (90% confidence): 5 points or 10% points  COGNITION: Overall cognitive status: Within functional limits for tasks assessed  SENSATION: WFL  POSTURE: carrying head in forward flexed position secondary to pain  PALPATION: Signif spasm present Rt>Lt SO, cervical paraspinals, upper trap, deep multifii   CERVICAL ROM:   Active ROM A/PROM (deg) eval  Flexion 30*  Extension 65  Right lateral flexion 20*  Left lateral flexion 25*  Right rotation 50*  Left rotation 30*   (Blank rows = not tested) *pain  UPPER EXTREMITY ROM: WNL without pain - Pt reports using more force with UE is painful  to neck and head such as dressing  UPPER EXTREMITY MMT:  5/5 bil UE  CERVICAL SPECIAL TESTS:  Relief with traction    TREATMENT DATE:  09/11/2023 UBE 3/3 6 total- PT present to discuss status 3 way scapular stabilization with blue loop x 5 each side 4 D ball rolls with 2 lb (light blue) plyo ball x 20 each direction on each arm Standing matrix lat pull down 2 x 10 with 35# Standing shoulder flexion and scaption with 2# 2 x 10 suggested but patient typically does 20 consecutive Prone wing taps 2 x 10 Prone pull downs 2 x 10 Supine cervical retraction to towel 2 x 10 Cervical Melt method (flexion/extension ; rotation) x 20 each direction Open Books x 10 each side Manual: Suboccipital release and STM to bilateral upper trap  09/08/2023 UBE 3/3 6 total- PT present to discuss status 3 way scapular stabilization with blue loop x 5 each side 4 D ball rolls with 2 lb (light blue) plyo ball x 20 each direction on each arm Tband shoulder extension, rows, bilateral ER and bilateral shoulder horizontal abduction 2 x 10 each with red band with handles Supine cervical retraction to towel 2 x 10 Cervical Melt method (flexion/extension ; rotation) x 20 each direction Open Books x 10 each side Manual: Suboccipital release and STM to bilateral upper trap  09/03/2023 UBE 3/3 6 total- PT present to discuss status 3 way scapular stabilization with blue loop x 5 each side 4 D ball rolls with 2 lb (light blue) plyo ball x 20 each direction on each arm Tband shoulder extension, rows, bilateral ER and bilateral shoulder horizontal abduction 2 x 10 each with red band with handles Supine cervical retraction to towel 2 x 10 Cervical Melt method (flexion/extension ; rotation) x 20 each direction Open Books x 10 each side Manual: Suboccipital release and STM to bilateral upper trap  PATIENT EDUCATION:  Education details: [id number] Person educated: Patient Education method: Explanation,  Demonstration, Verbal cues, and Handouts Education comprehension: verbalized understanding and returned demonstration  HOME EXERCISE PROGRAM: Access Code: [email or web address] URL: [email or web address] Date: 09/03/2023 Prepared by: Kristeen Sar  Exercises - Supine Cervical Retraction with Towel  - 1 x daily - 7 x weekly - 1 sets - 10 reps - 5 hold - Supine Cervical Rotation AROM on Pillow  - 1 x daily - 7 x weekly - 1 sets - 10 reps - 5 hold - Supine Isometric Neck Sidebend  - 1 x daily - 7 x weekly - 1 sets - 10 reps - 5 sec hold - Sidelying Open Book Thoracic Rotation with Knee on Foam Roll  - 1 x daily - 7 x weekly - 1 sets - 10 reps - Cat Cow  - 1 x daily - 7 x weekly - 1 sets - 10 reps  ASSESSMENT:  CLINICAL IMPRESSION: Aiyanah continues to demonstrate good progress.  She has difficulty relaxing the cervical and postural musculature and needs mod vc's for avoiding shoulder elevation.  We added wing taps and prone pull downs.  She fatigued easily with this but completed all reps.  She is well motivated and compliant.  She would benefit from continued skilled PT for postural strengthening, C spine ROM and stretching, along with manual techniques to address goals below.    OBJECTIVE IMPAIRMENTS: decreased activity tolerance, decreased mobility, decreased ROM, decreased strength, hypomobility, increased muscle spasms, impaired flexibility, impaired tone, impaired UE functional use, improper body mechanics, postural dysfunction, and pain.   ACTIVITY LIMITATIONS: carrying, lifting, sitting, standing, sleeping, bed mobility, bathing, dressing, reach over head, and hygiene/grooming  PARTICIPATION LIMITATIONS: meal prep, cleaning, laundry, driving, shopping, community activity, and occupation  PERSONAL FACTORS: 1 comorbidity: cervical arthritis are also affecting patient's functional outcome.   REHAB POTENTIAL: Excellent  CLINICAL DECISION MAKING: Stable/uncomplicated  EVALUATION  COMPLEXITY: Low   GOALS: Goals reviewed with patient? Yes  SHORT TERM GOALS: Target date: 09/18/23  Pt will be ind with initial HEP Baseline:  Goal status: MET 09/08/23  2.  Pt will be able to restore neutral alignment posture of neck/head secondary to reduced spasm and pain Baseline:  Goal status: In Progress  3.  Pt will be able to turn head with driving with min pain Baseline:  Goal status: In Progress  4.  Pt will be able to sleep in varied positions with min pain Baseline:  Goal status: In progress   LONG TERM GOALS: Target date: 10/16/23  Pt will be ind with advanced HEP and understand importance of compliance for maintenance of gains from PT.  Baseline:  Goal status: INITIAL  2.  NDI score improved to 12/50 or less to demo improved function Baseline: 17/50 Goal status: INITIAL  3.  Pt will be able to don/doff sports bra without neck pain Baseline:  Goal status: INITIAL  4.  Pt will report resolution of posterior headaches so she can more fully concentrate and perform daily tasks. Baseline:  Goal status: INITIAL     PLAN:  PT FREQUENCY: 1-2x/week  PT DURATION: 8 weeks  PLANNED INTERVENTIONS: 97110-Therapeutic exercises, 97530- Therapeutic activity, [id number]- Neuromuscular re-education, 97535- Self Care, 02859- Manual therapy, G0283- Electrical stimulation (unattended), [phone]- Electrical stimulation (manual), [id number]- Traction (mechanical), 20560 (1-2 muscles), 20561 (3+ muscles)- Dry Needling, Patient/Family education, Taping, Joint mobilization, Spinal mobilization, Cryotherapy, and Moist heat  PLAN FOR NEXT SESSION: Postural strengthening, scapular stabilization, manual techniques to reduce pain and spasm  of neck, neck ROM, neck stabilization (Review benefit of DN but patient still wants to hold off - fear of needles)   Delon B. Freddie Dymek, PT 09/11/23 9:06 AM [facility] [address], [address] [place], [place] [address] Phone #  [phone] Fax [phone]

## 2023-09-16 ENCOUNTER — Encounter: Payer: Self-pay | Admitting: Physical Therapy

## 2023-09-16 ENCOUNTER — Ambulatory Visit: Admitting: Physical Therapy

## 2023-09-16 DIAGNOSIS — M6281 Muscle weakness (generalized): Secondary | ICD-10-CM | POA: Diagnosis not present

## 2023-09-16 DIAGNOSIS — M542 Cervicalgia: Secondary | ICD-10-CM

## 2023-09-16 DIAGNOSIS — R293 Abnormal posture: Secondary | ICD-10-CM | POA: Diagnosis not present

## 2023-09-16 DIAGNOSIS — R252 Cramp and spasm: Secondary | ICD-10-CM

## 2023-09-16 NOTE — Patient Instructions (Signed)
 Puncin seca en puntos gatillo/de tensin Trigger Point Dry Needling  Qu es la puncin seca en puntos gatillo/de tensin (DN)? La DN es una tcnica de fisioterapia [place] para tratar Chief Technology Officer y las disfunciones musculares. Especficamente, la DN ayuda a desactivar los puntos gatillo musculares (nudos musculares). Se utiliza una aguja filiforme fina para penetrar en la piel y Risk manager el punto gatillo que se encuentra debajo. El objetivo es que se produzca una respuesta de Armed forces logistics/support/administrative officer (LTR) y que el punto gatillo se relaje. Durante el procedimiento no se inyecta ningn tipo de medicacin.    Beneficios de la puncin seca de puntos desencadenantes Reduce la tensin y la rigidez localizadas, favoreciendo la funcin muscular. Promueve el rango de movimiento y la flexibilidad del rea tratada. Alivia el dolor muscular localizado y referido.  Favorece el flujo sanguneo localizado.  Los beneficios de la puncin seca aumentan cuando se realiza junto con una terapia formal que incluye fortalecimiento y Film/video editor.  Qu se siente al aplicar la puncin seca en los puntos gatillo/de tensin? El procedimiento es diferente para cada paciente. Algunos pacientes dicen que en realidad no sienten la entrada de la aguja en la piel y que, en general, el proceso no es doloroso. Puede haber un sangrado muy leve. Sin embargo, muchos pacientes sienten un profundo calambre en el msculo en el que se ha introducido la [place]. Se trata de la respuesta de contraccin local.  Cmo me sentir despus del tratamiento? El dolor es normal y puede que no aparezca hasta pasadas algunas horas. Generalmente, este dolor no dura ms [employer].   Los moretones no son frecuentes; sin embargo, se puede Chemical engineer hielo para reducirlos.   En raros casos es normal sentir cansancio o nuseas despus del tratamiento. Adems, sus sntomas pueden empeorar antes de mejorar, este perodo no durar por lo general ms de 24  horas.  Qu puedo hacer despus del tratamiento? Aumente su hidratacin tomando ms agua durante las prximas 24 horas. Puede ponerse hielo o calor en las zonas tratadas que hayan quedado doloridas; sin embargo, no use calor en zonas inflamadas o con moretones. El calor con frecuencia alivia ms despus de la insercin de agujas. Puede continuar con sus actividades normales, pero no se recomienda hacer actividades vigorosas inicialmente despus del tratamiento por 24 horas. La DN funciona mejor cuando se lao [place] con otras terapias fsicas como el fortalecimiento, los estiramientos y [place] terapias.  Cules son las complicaciones? Aunque su terapeuta cuenta con una capacitacin amplia para minimizar los riesgos de la puncin seca en los puntos gatillo/de tensin, es importante conocer los riesgos de cualquier procedimiento. Los riesgos incluyen sangrado, Engineer, mining, fatiga, hematoma, infeccin, vrtigo, nuseas o afeccin de los nervios. Obsrvese para cualquier cambio en la piel o en la sensibilidad. Hable con su terapeuta o mdico si tiene inquietudes. Una complicacin poco frecuente pero grave es un neumotrax sobre o cerca de las zonas media y superior de su pecho y espalda. Si le hacen puncin seca en esta rea, obsrvese para los siguientes sntomas: Dificultad para respirar con el esfuerzo y/o Dificultad para respirar profundamente y/o Engineer, mining en el pecho y/o [place] tos seca Si aparece alguno de los sntomas anteriores, por favor vaya a la sala de urgencias ms cercana o llame al 911. Explqueles que le hicieron puncin seca sobre el trax e infrmeles de cualquier sntoma que tenga. Haga un seguimiento con el(la) terapeuta que le atendi despus de completar la evaluacin mdica.

## 2023-09-16 NOTE — Therapy (Signed)
 OUTPATIENT PHYSICAL THERAPY CERVICAL TREATMENT   Patient Name: Cathy Camacho MRN: [id number] DOB:January 29, 1961, 63 y.o., female Today's Date: 09/16/2023  END OF SESSION:  PT End of Session - 09/16/23 1040     Visit Number 5    Date for PT Re-Evaluation 10/16/23    Authorization Type BCBS no auth req'd    PT Start Time 0930    PT Stop Time 1014    PT Time Calculation (min) 44 min    Activity Tolerance Patient tolerated treatment well    Behavior During Therapy [facility] for tasks assessed/performed            Past Medical History:  Diagnosis Date   Hyperlipidemia    Osteoporosis    Vitamin B12 deficiency    Vitamin D  deficiency    Past Surgical History:  Procedure Laterality Date   APPENDECTOMY     Patient Active Problem List   Diagnosis Date Noted   Pelvic pain 06/16/2022   Hyperlipidemia 10/19/2020   Vitamin D  deficiency 10/19/2020   Vitamin B12 deficiency 10/19/2020   Osteoporosis 10/19/2020   Globus sensation 09/20/2015   Hoarseness 09/20/2015   Laryngopharyngeal reflux (LPR) 09/20/2015    PCP: Theophilus Andrews, Tully GRADE, MD  REFERRING PROVIDER: Theophilus Andrews, Tully GRADE, MD  REFERRING DIAG: M54.2 (ICD-10-CM) - Cervicalgia  THERAPY DIAG:  Cervicalgia  Abnormal posture  Muscle weakness (generalized)  Cramp and spasm  Rationale for Evaluation and Treatment: Rehabilitation  ONSET DATE: 1 month  SUBJECTIVE:                                                                                                                                                                                                         SUBJECTIVE STATEMENT: Patient reports she is feeling better and she thinks physical therapy along with her herbal techniques are helping. Pain is 3/10. She is still thinking about doing dry needling.   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  About a month ago I took a nap for about 20 min and woke up with lots of pain.  I  took a trip and it's still there.  I have a headache in the back of my head, my neck is so stiff and I can't turn either way.  Tylenol helps but when it wears off I can really tell.  It feels too hard to hold my head up.  I am not sleeping well but I got a better contoured pillow which helps my neck.  Hand dominance: Right  PERTINENT HISTORY:  osteoporosis  PAIN:  PAIN: 09/11/2023 Are you having pain? Yes NPRS scale: 3/10 Pain location: back of head, posterior neck to shoulders bil, into intrascapular region Pain orientation: Bilateral  PAIN TYPE: aching, dull, sharp, and tight Pain description: constant  Aggravating factors: holding head up, turning head left and right, trying to sleep Relieving factors: Tylenol, muscle relaxers   PRECAUTIONS: None  RED FLAGS: None     WEIGHT BEARING RESTRICTIONS: No  FALLS:  Has patient fallen in last 6 months? 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  DIAGNOSTIC FINDINGS:  None at this time  PATIENT SURVEYS:  NDI: 17/50  Minimum Detectable Change (90% confidence): 5 points or 10% points  COGNITION: Overall cognitive status: Within functional limits for tasks assessed  SENSATION: WFL  POSTURE: carrying head in forward flexed position secondary to pain  PALPATION: Signif spasm present Rt>Lt SO, cervical paraspinals, upper trap, deep multifii   CERVICAL ROM:   Active ROM A/PROM (deg) eval  Flexion 30*  Extension 65  Right lateral flexion 20*  Left lateral flexion 25*  Right rotation 50*  Left rotation 30*   (Blank rows = not tested) *pain  UPPER EXTREMITY ROM: WNL without pain - Pt reports using more force with UE is painful to neck and head such as dressing  UPPER  EXTREMITY MMT:  5/5 bil UE  CERVICAL SPECIAL TESTS:  Relief with traction    TREATMENT DATE:  09/16/2023 UBE 3/3 6 total- PT present to discuss status 3 way scapular stabilization with yellow loop x 8 each side 4 D ball rolls with 2 lb (light blue) plyo ball x 20 each direction on each arm Y's at wall 2 x 10 Standing matrix lat pull down 2 x 10 with 35# Standing shoulder flexion and scaption with 2# 2 x 10  Prone wing taps 2 x 10 Prone pull downs 2 x 10 Supine D2 flexion with red TB 2 x 10 Cervical Melt method (flexion/extension ; rotation) x 20 each direction Manual: Suboccipital release and STM to bilateral upper trap     09/11/2023 UBE 3/3 6 total- PT present to discuss status 3 way scapular stabilization with blue loop x 5 each side 4 D ball rolls with 2 lb (light blue) plyo ball x 20 each direction on each arm Standing matrix lat pull down 2 x 10 with 35# Standing shoulder flexion and scaption with 2# 2 x 10 suggested but patient typically does 20 consecutive Prone wing taps 2 x 10 Prone pull downs 2 x 10 Supine cervical retraction to towel 2 x 10 Cervical Melt method (flexion/extension ; rotation) x 20 each direction Open Books x 10 each side Manual: Suboccipital release and STM to bilateral upper trap  09/08/2023 UBE 3/3 6 total- PT present to discuss status 3 way scapular stabilization with blue loop x 5 each side 4 D ball rolls with 2 lb (light blue) plyo ball x 20 each direction on each arm Tband shoulder extension, rows, bilateral ER and bilateral shoulder horizontal abduction 2 x 10 each with red band with handles Supine cervical retraction to towel 2 x 10 Cervical Melt method (flexion/extension ; rotation) x 20 each direction Open Books x 10 each side Manual: Suboccipital release and STM to bilateral  upper trap                  PATIENT EDUCATION:  Education details: [id number] Person educated: Patient Education method: Explanation, Demonstration, Verbal  cues, and Handouts Education comprehension: verbalized understanding and returned demonstration  HOME EXERCISE PROGRAM: Access Code: [email or web address] URL: [email or web address] Date: 09/03/2023 Prepared by: Kristeen Sar  Exercises - Supine Cervical Retraction with Towel  - 1 x daily - 7 x weekly - 1 sets - 10 reps - 5 hold - Supine Cervical Rotation AROM on Pillow  - 1 x daily - 7 x weekly - 1 sets - 10 reps - 5 hold - Supine Isometric Neck Sidebend  - 1 x daily - 7 x weekly - 1 sets - 10 reps - 5 sec hold - Sidelying Open Book Thoracic Rotation with Knee on Foam Roll  - 1 x daily - 7 x weekly - 1 sets - 10 reps - Cat Cow  - 1 x daily - 7 x weekly - 1 sets - 10 reps  ASSESSMENT:  CLINICAL IMPRESSION: shaneya taketa that she is doing better with physical therapy and she feels that it is helping her pain. She is still contemplating doing dry needling. Provided patient with the spanish dry needling education handout today and added to patient instructions. Today's treatment session focused on postural strengthening and manual therapy techniques. Patient required verbal cues to take rest breaks in between sets to maintain proper form. Overall, she is tolerating current level of exercise intensity well.  OBJECTIVE IMPAIRMENTS: decreased activity tolerance, decreased mobility, decreased ROM, decreased strength, hypomobility, increased muscle spasms, impaired flexibility, impaired tone, impaired UE functional use, improper body mechanics, postural dysfunction, and pain.   ACTIVITY LIMITATIONS: carrying, lifting, sitting, standing, sleeping, bed mobility, bathing, dressing, reach over head, and hygiene/grooming  PARTICIPATION LIMITATIONS: meal prep, cleaning, laundry, driving, shopping, community activity, and occupation  PERSONAL FACTORS: 1 comorbidity: cervical arthritis are also affecting patient's functional outcome.   REHAB POTENTIAL: Excellent  CLINICAL DECISION MAKING:  Stable/uncomplicated  EVALUATION COMPLEXITY: Low   GOALS: Goals reviewed with patient? Yes  SHORT TERM GOALS: Target date: 09/18/23  Pt will be ind with initial HEP Baseline:  Goal status: MET 09/08/23  2.  Pt will be able to restore neutral alignment posture of neck/head secondary to reduced spasm and pain Baseline:  Goal status: In Progress  3.  Pt will be able to turn head with driving with min pain Baseline:  Goal status: In Progress  4.  Pt will be able to sleep in varied positions with min pain Baseline:  Goal status: In progress   LONG TERM GOALS: Target date: 10/16/23  Pt will be ind with advanced HEP and understand importance of compliance for maintenance of gains from PT.  Baseline:  Goal status: INITIAL  2.  NDI score improved to 12/50 or less to demo improved function Baseline: 17/50 Goal status: INITIAL  3.  Pt will be able to don/doff sports bra without neck pain Baseline:  Goal status: INITIAL  4.  Pt will report resolution of posterior headaches so she can more fully concentrate and perform daily tasks. Baseline:  Goal status: INITIAL     PLAN:  PT FREQUENCY: 1-2x/week  PT DURATION: 8 weeks  PLANNED INTERVENTIONS: 97110-Therapeutic exercises, 97530- Therapeutic activity, [id number]- Neuromuscular re-education, 97535- Self Care, 02859- Manual therapy, G0283- Electrical stimulation (unattended), [phone]- Electrical stimulation (manual), [id number]- Traction (mechanical), 20560 (1-2 muscles), 20561 (3+ muscles)- Dry Needling, Patient/Family education, Taping, Joint mobilization, Spinal mobilization,  Cryotherapy, and Moist heat  PLAN FOR NEXT SESSION: Postural strengthening, scapular stabilization, manual techniques to reduce pain and spasm of neck, neck ROM, neck stabilization (Review benefit of DN but patient still wants to hold off - fear of needles)   Kristeen Sar, PT 09/16/23 10:41 AM [facility] [address] [place], [place] [address] Phone # [phone] Fax [phone]

## 2023-09-18 ENCOUNTER — Ambulatory Visit

## 2023-09-18 DIAGNOSIS — R252 Cramp and spasm: Secondary | ICD-10-CM | POA: Diagnosis not present

## 2023-09-18 DIAGNOSIS — M542 Cervicalgia: Secondary | ICD-10-CM

## 2023-09-18 DIAGNOSIS — M6281 Muscle weakness (generalized): Secondary | ICD-10-CM

## 2023-09-18 DIAGNOSIS — R293 Abnormal posture: Secondary | ICD-10-CM | POA: Diagnosis not present

## 2023-09-18 NOTE — Therapy (Signed)
 OUTPATIENT PHYSICAL THERAPY CERVICAL TREATMENT   Patient Name: Cathy Camacho MRN: [id number] DOB:1961/01/20, 63 y.o., female Today's Date: 09/18/2023  END OF SESSION:  PT End of Session - 09/18/23 0944     Visit Number 6    Date for PT Re-Evaluation 10/16/23    Authorization Type BCBS no auth req'd    PT Start Time 0930    PT Stop Time 1015    PT Time Calculation (min) 45 min    Activity Tolerance Patient tolerated treatment well    Behavior During Therapy [facility] for tasks assessed/performed            Past Medical History:  Diagnosis Date   Hyperlipidemia    Osteoporosis    Vitamin B12 deficiency    Vitamin D  deficiency    Past Surgical History:  Procedure Laterality Date   APPENDECTOMY     Patient Active Problem List   Diagnosis Date Noted   Pelvic pain 06/16/2022   Hyperlipidemia 10/19/2020   Vitamin D  deficiency 10/19/2020   Vitamin B12 deficiency 10/19/2020   Osteoporosis 10/19/2020   Globus sensation 09/20/2015   Hoarseness 09/20/2015   Laryngopharyngeal reflux (LPR) 09/20/2015    PCP: Theophilus Andrews, Tully GRADE, MD  REFERRING PROVIDER: Theophilus Andrews, Tully GRADE, MD  REFERRING DIAG: M54.2 (ICD-10-CM) - Cervicalgia  THERAPY DIAG:  Cervicalgia  Abnormal posture  Muscle weakness (generalized)  Cramp and spasm  Rationale for Evaluation and Treatment: Rehabilitation  ONSET DATE: 1 month  SUBJECTIVE:                                                                                                                                                                                                         SUBJECTIVE STATEMENT: Patient reports she is doing pretty good.  Pain 3/10.   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  About a month ago I took a nap for about 20 min and woke up with lots of pain.  I took a trip and it's still there.  I have a headache in the back of my head, my neck is so stiff and I can't turn either way.   Tylenol helps but when it wears off I can really tell.  It feels too hard to hold my head up.  I am not sleeping well but I got a better contoured pillow which helps my neck.  Hand dominance: Right  PERTINENT HISTORY:  osteoporosis  PAIN:  PAIN: 09/18/2023 Are you having pain? Yes NPRS scale: 3/10 Pain location: back of head,  posterior neck to shoulders bil, into intrascapular region Pain orientation: Bilateral  PAIN TYPE: aching, dull, sharp, and tight Pain description: constant  Aggravating factors: holding head up, turning head left and right, trying to sleep Relieving factors: Tylenol, muscle relaxers   PRECAUTIONS: None  RED FLAGS: None     WEIGHT BEARING RESTRICTIONS: No  FALLS:  Has patient fallen in last 6 months? 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  DIAGNOSTIC FINDINGS:  None at this time  PATIENT SURVEYS:  NDI: 17/50  Minimum Detectable Change (90% confidence): 5 points or 10% points  COGNITION: Overall cognitive status: Within functional limits for tasks assessed  SENSATION: WFL  POSTURE: carrying head in forward flexed position secondary to pain  PALPATION: Signif spasm present Rt>Lt SO, cervical paraspinals, upper trap, deep multifii   CERVICAL ROM:   Active ROM A/PROM (deg) eval  Flexion 30*  Extension 65  Right lateral flexion 20*  Left lateral flexion 25*  Right rotation 50*  Left rotation 30*   (Blank rows = not tested) *pain  UPPER EXTREMITY ROM: WNL without pain - Pt reports using more force with UE is painful to neck and head such as dressing  UPPER EXTREMITY MMT:  5/5 bil UE  CERVICAL SPECIAL TESTS:  Relief with traction    TREATMENT DATE:  09/18/2023 UBE 3/3 6  total- PT present to discuss status 3 way scapular stabilization with yellow loop x 8 each side 4 D ball rolls with 2 lb (light blue) plyo ball x 20 each direction on each arm Prone shoulder extension, rows and horizontal abduction x 20 each with 2 lbs Side lying ER with 2 lbs 2 x 10 (patient needed mod verbal and tactile cue for correct technique) Supine serratus punch with 2 lbs x 20 Trigger Point Dry Needling Initial Treatment: Pt instructed on Dry Needling rational, procedures, and possible side effects. Pt instructed to expect mild to moderate muscle soreness later in the day and/or into the next day.  Pt instructed in methods to reduce muscle soreness. Pt instructed to continue prescribed HEP. Because Dry Needling was performed over or adjacent to a lung field, pt was educated on S/S of pneumothorax and to seek immediate medical attention should they occur.  Patient was educated on signs and symptoms of infection and other risk factors and advised to seek medical attention should they occur.  Patient verbalized understanding of these instructions and education.   Patient Verbal Consent Given: Yes Education Handout Provided: Previously Provided Muscles Treated: bilateral upper traps Electrical Stimulation Performed: No Treatment Response/Outcome: Skilled palpation used to identify taut bands and trigger points.  Once identified, dry needling techniques used to treat these areas.  Twitch response ellicited along with palpable elongation of muscle.  Following treatment, patient reported increased neck mobility and less tension in the upper trap areas   09/16/2023 UBE 3/3 6 total- PT present to discuss status 3 way scapular stabilization with yellow loop x 8 each side 4 D ball rolls with 2 lb (light blue) plyo ball x 20 each direction on each arm Y's at wall 2 x 10 Standing matrix lat pull down 2 x 10 with 35# Standing shoulder flexion and scaption with 2# 2 x 10  Prone wing taps 2  x  10 Prone pull downs 2 x 10 Supine D2 flexion with red TB 2 x 10 Cervical Melt method (flexion/extension ; rotation) x 20 each direction Manual: Suboccipital release and STM to bilateral upper trap  09/11/2023 UBE 3/3 6 total- PT present to discuss status 3 way scapular stabilization with blue loop x 5 each side 4 D ball rolls with 2 lb (light blue) plyo ball x 20 each direction on each arm Standing matrix lat pull down 2 x 10 with 35# Standing shoulder flexion and scaption with 2# 2 x 10 suggested but patient typically does 20 consecutive Prone wing taps 2 x 10 Prone pull downs 2 x 10 Supine cervical retraction to towel 2 x 10 Cervical Melt method (flexion/extension ; rotation) x 20 each direction Open Books x 10 each side Manual: Suboccipital release and STM to bilateral upper trap   PATIENT EDUCATION:  Education details: [email or web address] Person educated: Patient Education method: Explanation, Demonstration, Verbal cues, and Handouts Education comprehension: verbalized understanding and returned demonstration  HOME EXERCISE PROGRAM: Access Code: [email or web address] URL: [email or web address] Date: 09/03/2023 Prepared by: Kristeen Sar  Exercises - Supine Cervical Retraction with Towel  - 1 x daily - 7 x weekly - 1 sets - 10 reps - 5 hold - Supine Cervical Rotation AROM on Pillow  - 1 x daily - 7 x weekly - 1 sets - 10 reps - 5 hold - Supine Isometric Neck Sidebend  - 1 x daily - 7 x weekly - 1 sets - 10 reps - 5 sec hold - Sidelying Open Book Thoracic Rotation with Knee on Foam Roll  - 1 x daily - 7 x weekly - 1 sets - 10 reps - Cat Cow  - 1 x daily - 7 x weekly - 1 sets - 10 reps  ASSESSMENT:  CLINICAL IMPRESSION: Nattie is progressing appropriately but continues to demonstrate scapular elevation.  This is resulting in a significant amount of upper trap tension and pain.  We discussed the merits of dry needling and she was willing to try today.  She had good twitch responses in  right > left.  We also added prone shoulder series to further stabilize both shoulders and reduce upper trap and levator compensatory patterns.  She demonstrates significant weakness in both shoulders but was able to complete all.  She would benefit from continuing skilled PT to address final goals.    OBJECTIVE IMPAIRMENTS: decreased activity tolerance, decreased mobility, decreased ROM, decreased strength, hypomobility, increased muscle spasms, impaired flexibility, impaired tone, impaired UE functional use, improper body mechanics, postural dysfunction, and pain.   ACTIVITY LIMITATIONS: carrying, lifting, sitting, standing, sleeping, bed mobility, bathing, dressing, reach over head, and hygiene/grooming  PARTICIPATION LIMITATIONS: meal prep, cleaning, laundry, driving, shopping, community activity, and occupation  PERSONAL FACTORS: 1 comorbidity: cervical arthritis are also affecting patient's functional outcome.   REHAB POTENTIAL: Excellent  CLINICAL DECISION MAKING: Stable/uncomplicated  EVALUATION COMPLEXITY: Low   GOALS: Goals reviewed with patient? Yes  SHORT TERM GOALS: Target date: 09/18/23  Pt will be ind with initial HEP Baseline:  Goal status: MET 09/08/23  2.  Pt will be able to restore neutral alignment posture of neck/head secondary to reduced spasm and pain Baseline:  Goal status: In Progress  3.  Pt will be able to turn head with driving with min pain Baseline:  Goal status: In Progress  4.  Pt will be able to sleep in varied positions with min pain Baseline:  Goal status: In progress  LONG TERM GOALS: Target date: 10/16/23  Pt will be ind with advanced HEP and understand importance of compliance for maintenance of gains from PT.  Baseline:  Goal status: INITIAL  2.  NDI score improved to 12/50 or less to demo improved function Baseline: 17/50 Goal status: INITIAL  3.  Pt will be able to don/doff sports bra without neck pain Baseline:  Goal status:  INITIAL  4.  Pt will report resolution of posterior headaches so she can more fully concentrate and perform daily tasks. Baseline:  Goal status: INITIAL     PLAN:  PT FREQUENCY: 1-2x/week  PT DURATION: 8 weeks  PLANNED INTERVENTIONS: 97110-Therapeutic exercises, 97530- Therapeutic activity, [id number]- Neuromuscular re-education, 97535- Self Care, 02859- Manual therapy, G0283- Electrical stimulation (unattended), [phone]- Electrical stimulation (manual), [id number]- Traction (mechanical), 20560 (1-2 muscles), 20561 (3+ muscles)- Dry Needling, Patient/Family education, Taping, Joint mobilization, Spinal mobilization, Cryotherapy, and Moist heat  PLAN FOR NEXT SESSION: Assess response to DN #1,  Postural strengthening, scapular stabilization, manual techniques to reduce pain and spasm of neck, neck ROM, neck stabilization    Der Gagliano B. Noa Constante, PT 09/18/23 11:20 AM [facility] [address] [place], [place] [address] Phone # [phone] Fax [phone]

## 2023-09-21 ENCOUNTER — Encounter: Payer: Self-pay | Admitting: Physical Therapy

## 2023-09-21 ENCOUNTER — Ambulatory Visit: Admitting: Physical Therapy

## 2023-09-21 DIAGNOSIS — M6281 Muscle weakness (generalized): Secondary | ICD-10-CM

## 2023-09-21 DIAGNOSIS — R293 Abnormal posture: Secondary | ICD-10-CM

## 2023-09-21 DIAGNOSIS — R252 Cramp and spasm: Secondary | ICD-10-CM | POA: Diagnosis not present

## 2023-09-21 DIAGNOSIS — M542 Cervicalgia: Secondary | ICD-10-CM | POA: Diagnosis not present

## 2023-09-21 NOTE — Therapy (Signed)
 OUTPATIENT PHYSICAL THERAPY CERVICAL TREATMENT   Patient Name: Cathy Camacho MRN: [id number] DOB:1961/01/07, 63 y.o., female Today's Date: 09/21/2023  END OF SESSION:  PT End of Session - 09/21/23 0935     Visit Number 7    Date for PT Re-Evaluation 10/16/23    Authorization Type [employer]BS no auth req'd    PT Start Time 0843    PT Stop Time 0928    PT Time Calculation (min) 45 min    Activity Tolerance Patient tolerated treatment well    Behavior During Therapy [facility] for tasks assessed/performed             Past Medical History:  Diagnosis Date   Hyperlipidemia    Osteoporosis    Vitamin B12 deficiency    Vitamin D  deficiency    Past Surgical History:  Procedure Laterality Date   APPENDECTOMY     Patient Active Problem List   Diagnosis Date Noted   Pelvic pain 06/16/2022   Hyperlipidemia 10/19/2020   Vitamin D  deficiency 10/19/2020   Vitamin B12 deficiency 10/19/2020   Osteoporosis 10/19/2020   Globus sensation 09/20/2015   Hoarseness 09/20/2015   Laryngopharyngeal reflux (LPR) 09/20/2015    PCP: Theophilus Andrews, Tully GRADE, MD  REFERRING PROVIDER: Theophilus Andrews, Tully GRADE, MD  REFERRING DIAG: M54.2 (ICD-10-CM) - Cervicalgia  THERAPY DIAG:  Cervicalgia  Abnormal posture  Muscle weakness (generalized)  Cramp and spasm  Rationale for Evaluation and Treatment: Rehabilitation  ONSET DATE: 1 month  SUBJECTIVE:                                                                                                                                                                                                         SUBJECTIVE STATEMENT: Patient reports she did not feel much of a difference after dry needling last session. She is not opposed to getting dry needling again. She just feels more neck stiffness today.   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  About a month ago I took a nap for about 20 min and woke up with lots of pain.   I took a trip and it's still there.  I have a headache in the back of my head, my neck is so stiff and I can't turn either way.  Tylenol helps but when it wears off I can really tell.  It feels too hard to hold my head up.  I am not sleeping well but I got a better contoured pillow which helps my neck.  Hand dominance: Right  PERTINENT HISTORY:  osteoporosis  PAIN:  PAIN: 09/18/2023 Are you having pain? Yes NPRS scale: 3/10 Pain location: back of head, posterior neck to shoulders bil, into intrascapular region Pain orientation: Bilateral  PAIN TYPE: aching, dull, sharp, and tight Pain description: constant  Aggravating factors: holding head up, turning head left and right, trying to sleep Relieving factors: Tylenol, muscle relaxers   PRECAUTIONS: None  RED FLAGS: None     WEIGHT BEARING RESTRICTIONS: No  FALLS:  Has patient fallen in last 6 months? 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  DIAGNOSTIC FINDINGS:  None at this time  PATIENT SURVEYS:  NDI: 17/50  Minimum Detectable Change (90% confidence): 5 points or 10% points  COGNITION: Overall cognitive status: Within functional limits for tasks assessed  SENSATION: WFL  POSTURE: carrying head in forward flexed position secondary to pain  PALPATION: Signif spasm present Rt>Lt SO, cervical paraspinals, upper trap, deep multifii   CERVICAL ROM:   Active ROM A/PROM (deg) eval  Flexion 30*  Extension 65  Right lateral flexion 20*  Left lateral flexion 25*  Right rotation 50*  Left rotation 30*   (Blank rows = not tested) *pain  UPPER EXTREMITY ROM: WNL without pain - Pt reports using more force with UE is painful to neck and head such as  dressing  UPPER EXTREMITY MMT:  5/5 bil UE  CERVICAL SPECIAL TESTS:  Relief with traction    TREATMENT DATE:  09/21/2023 UBE 3/3 6 total- PT present to discuss status 3 way scapular stabilization with yellow loop x 10 each side 4 D ball rolls with 2 lb (yellow) plyo ball x 20 each direction on each arm Y's at wall 2 x 10 Standing matrix lat pull down 2 x 10 with 35# Single arm row + thoracic rotation x 12 bilateral  Prone shoulder extension, rows and horizontal abduction x 20 with 2 lbs bilateral  Side lying ER with 2 lbs 2 x 10 bilateral  Supine serratus punch with 2 lbs x 20 bilateral     09/18/2023 UBE 3/3 6 total- PT present to discuss status 3 way scapular stabilization with yellow loop x 8 each side 4 D ball rolls with 2 lb (light blue) plyo ball x 20 each direction on each arm Prone shoulder extension, rows and horizontal abduction x 20 each with 2 lbs Side lying ER with 2 lbs 2 x 10 (patient needed mod verbal and tactile cue for correct technique) Supine serratus punch with 2 lbs x 20 Trigger Point Dry Needling Initial Treatment: Pt instructed on Dry Needling rational, procedures, and possible side effects. Pt instructed to expect mild to moderate muscle soreness later in the day and/or into the next day.  Pt instructed in methods to reduce muscle soreness. Pt instructed to continue prescribed HEP. Because Dry Needling was performed over or adjacent to a lung field, pt was educated on S/S of pneumothorax and to seek immediate medical attention should they occur.  Patient was educated on signs and symptoms of infection and other risk factors and advised to seek medical attention should they occur.  Patient verbalized understanding of these instructions and education.   Patient Verbal Consent Given: Yes Education Handout Provided: Previously Provided Muscles Treated: bilateral upper traps Electrical  Stimulation Performed: No Treatment Response/Outcome: Skilled  palpation used to identify taut bands and trigger points.  Once identified, dry needling techniques used to treat these areas.  Twitch response ellicited along with palpable elongation of muscle.  Following treatment, patient reported increased neck mobility and less tension in the upper trap areas   09/16/2023 UBE 3/3 6 total- PT present to discuss status 3 way scapular stabilization with yellow loop x 8 each side 4 D ball rolls with 2 lb (light blue) plyo ball x 20 each direction on each arm Y's at wall 2 x 10 Standing matrix lat pull down 2 x 10 with 35# Standing shoulder flexion and scaption with 2# 2 x 10  Prone wing taps 2 x 10 Prone pull downs 2 x 10 Supine D2 flexion with red TB 2 x 10 Cervical Melt method (flexion/extension ; rotation) x 20 each direction Manual: Suboccipital release and STM to bilateral upper trap   PATIENT EDUCATION:  Education details: [email or web address] Person educated: Patient Education method: Explanation, Demonstration, Verbal cues, and Handouts Education comprehension: verbalized understanding and returned demonstration  HOME EXERCISE PROGRAM: Access Code: [email or web address] URL: [email or web address] Date: 09/03/2023 Prepared by: Kristeen Sar  Exercises - Supine Cervical Retraction with Towel  - 1 x daily - 7 x weekly - 1 sets - 10 reps - 5 hold - Supine Cervical Rotation AROM on Pillow  - 1 x daily - 7 x weekly - 1 sets - 10 reps - 5 hold - Supine Isometric Neck Sidebend  - 1 x daily - 7 x weekly - 1 sets - 10 reps - 5 sec hold - Sidelying Open Book Thoracic Rotation with Knee on Foam Roll  - 1 x daily - 7 x weekly - 1 sets - 10 reps - Cat Cow  - 1 x daily - 7 x weekly - 1 sets - 10 reps  ASSESSMENT:  CLINICAL IMPRESSION: Raelan presents to therapy with increased neck stiffness today. She verbalized not noticing any improvements after dry needling last session, but she is not opposed to trying it again. She verbalized feeling 80% better since starting  therapy. Cervical rotation and sleeping is still challenging for her. Patient tolerated strength progressions well today. She verbalized some muscle fatigue but she was able to perform all exercises well. PT monitored patient throughout and provided verbal and visual cues as needed. Patient will benefit from skilled PT to address the below impairments and improve overall function.   OBJECTIVE IMPAIRMENTS: decreased activity tolerance, decreased mobility, decreased ROM, decreased strength, hypomobility, increased muscle spasms, impaired flexibility, impaired tone, impaired UE functional use, improper body mechanics, postural dysfunction, and pain.   ACTIVITY LIMITATIONS: carrying, lifting, sitting, standing, sleeping, bed mobility, bathing, dressing, reach over head, and hygiene/grooming  PARTICIPATION LIMITATIONS: meal prep, cleaning, laundry, driving, shopping, community activity, and occupation  PERSONAL FACTORS: 1 comorbidity: cervical arthritis are also affecting patient's functional outcome.   REHAB POTENTIAL: Excellent  CLINICAL DECISION MAKING: Stable/uncomplicated  EVALUATION COMPLEXITY: Low   GOALS: Goals reviewed with patient? Yes  SHORT TERM GOALS: Target date: 09/18/23  Pt will be ind with initial HEP Baseline:  Goal status: MET 09/08/23  2.  Pt will be able to restore neutral alignment posture of neck/head secondary to reduced spasm and pain Baseline:  Goal status: In Progress 09/21/2023  3.  Pt will be able to turn head with driving with min pain Baseline:  Goal status: In Progress 09/21/2023 (still has pain she does not turn her head much)  4.  Pt will be able to sleep in varied positions with min pain Baseline:  Goal status: In progress 09/21/2023   LONG TERM GOALS: Target date: 10/16/23  Pt will be ind with advanced HEP and understand importance of compliance for maintenance of gains from PT.  Baseline:  Goal status: INITIAL  2.  NDI score improved to 12/50 or  less to demo improved function Baseline: 17/50 Goal status: INITIAL  3.  Pt will be able to don/doff sports bra without neck pain Baseline:  Goal status: INITIAL  4.  Pt will report resolution of posterior headaches so she can more fully concentrate and perform daily tasks. Baseline:  Goal status: INITIAL     PLAN:  PT FREQUENCY: 1-2x/week  PT DURATION: 8 weeks  PLANNED INTERVENTIONS: 97110-Therapeutic exercises, 97530- Therapeutic activity, 97112- Neuromuscular re-education, 97535- Self Care, 02859- Manual therapy, G0283- Electrical stimulation (unattended), [phone]- Electrical stimulation (manual), [id number]- Traction (mechanical), 20560 (1-2 muscles), 20561 (3+ muscles)- Dry Needling, Patient/Family education, Taping, Joint mobilization, Spinal mobilization, Cryotherapy, and Moist heat  PLAN FOR NEXT SESSION:  NDI; Postural strengthening, scapular stabilization, manual techniques to reduce pain and spasm of neck, neck ROM, neck stabilization    Kristeen Sar, PT 09/21/23 9:36 AM [facility] [address] [place], [place] [address] Phone # [phone] Fax [phone]

## 2023-09-22 ENCOUNTER — Other Ambulatory Visit: Payer: Self-pay | Admitting: Internal Medicine

## 2023-09-22 DIAGNOSIS — E782 Mixed hyperlipidemia: Secondary | ICD-10-CM

## 2023-09-22 MED ORDER — ATORVASTATIN CALCIUM 40 MG PO TABS: 40.0000 mg | ORAL_TABLET | Freq: Every day | ORAL | 0 refills | Status: AC

## 2023-09-25 ENCOUNTER — Ambulatory Visit: Attending: Internal Medicine | Admitting: Physical Therapy

## 2023-09-25 ENCOUNTER — Encounter: Payer: Self-pay | Admitting: Physical Therapy

## 2023-09-25 DIAGNOSIS — R252 Cramp and spasm: Secondary | ICD-10-CM | POA: Insufficient documentation

## 2023-09-25 DIAGNOSIS — M6281 Muscle weakness (generalized): Secondary | ICD-10-CM | POA: Insufficient documentation

## 2023-09-25 DIAGNOSIS — M542 Cervicalgia: Secondary | ICD-10-CM | POA: Insufficient documentation

## 2023-09-25 DIAGNOSIS — R293 Abnormal posture: Secondary | ICD-10-CM | POA: Insufficient documentation

## 2023-09-25 NOTE — Therapy (Signed)
 OUTPATIENT PHYSICAL THERAPY CERVICAL TREATMENT   Patient Name: Cathy Camacho MRN: [id number] DOB:05-Nov-1960, 63 y.o., female Today's Date: 09/25/2023  END OF SESSION:  PT End of Session - 09/25/23 0755     Visit Number 8    Date for PT Re-Evaluation 10/16/23    Authorization Type [employer] no auth req'd - Pt signed DN form for Aug    PT Start Time 0800    PT Stop Time 0846    PT Time Calculation (min) 46 min    Activity Tolerance Patient tolerated treatment well    Behavior During Therapy [facility] for tasks assessed/performed             Past Medical History:  Diagnosis Date   Hyperlipidemia    Osteoporosis    Vitamin B12 deficiency    Vitamin D  deficiency    Past Surgical History:  Procedure Laterality Date   APPENDECTOMY     Patient Active Problem List   Diagnosis Date Noted   Pelvic pain 06/16/2022   Hyperlipidemia 10/19/2020   Vitamin D  deficiency 10/19/2020   Vitamin B12 deficiency 10/19/2020   Osteoporosis 10/19/2020   Globus sensation 09/20/2015   Hoarseness 09/20/2015   Laryngopharyngeal reflux (LPR) 09/20/2015    PCP: Theophilus Andrews, Tully GRADE, MD  REFERRING PROVIDER: Theophilus Andrews, Tully GRADE, MD  REFERRING DIAG: M54.2 (ICD-10-CM) - Cervicalgia  THERAPY DIAG:  Cervicalgia  Abnormal posture  Muscle weakness (generalized)  Cramp and spasm  Rationale for Evaluation and Treatment: Rehabilitation  ONSET DATE: 1 month  SUBJECTIVE:                                                                                                                                                                                                         SUBJECTIVE STATEMENT: I still have some pain but I am much better. I still have problems with looking both ways but I have more pain on the Rt side.  The neck pain is always there but sometimes I get a headache in the back of the head on both sides and then I need Tylenol.   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  About a month ago I took a nap for about 20 min and woke up with lots of pain.  I took a trip and it's still there.  I have a headache in the back of my head, my neck is so stiff and I can't turn either way.  Tylenol helps but when it wears off I can really tell.  It  feels too hard to hold my head up.  I am not sleeping well but I got a better contoured pillow which helps my neck.  Hand dominance: Right  PERTINENT HISTORY:  osteoporosis  PAIN:  PAIN: 09/18/2023 Are you having pain? Yes NPRS scale: 3/10 Pain location: back of head, posterior neck to shoulders bil, into intrascapular region Pain orientation: Bilateral  PAIN TYPE: aching, dull, sharp, and tight Pain description: constant  Aggravating factors: holding head up, turning head left and right, trying to sleep Relieving factors: Tylenol   PRECAUTIONS: None  RED FLAGS: None     WEIGHT BEARING RESTRICTIONS: No  FALLS:  Has patient fallen in last 6 months? 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  DIAGNOSTIC FINDINGS:  None at this time  PATIENT SURVEYS:  09/25/23 NDI: 11/50  Eval: NDI: 17/50  Minimum Detectable Change (90% confidence): 5 points or 10% points  COGNITION: Overall cognitive status: Within functional limits for tasks assessed  SENSATION: WFL  POSTURE: carrying head in forward flexed position secondary to pain  PALPATION: Signif spasm present Rt>Lt SO, cervical paraspinals, upper trap, deep multifii   CERVICAL ROM:   Active ROM A/PROM (deg) eval A/ROM 09/25/23  Flexion 30* 40  Extension 65 55  Right lateral flexion 20* 30  Left lateral flexion 25* 30  Right rotation 50* 50* pre-DN, 50 no pain after DN  Left  rotation 30* 35* pre-DN, 50 no pain after DN   (Blank rows = not tested) *pain  UPPER EXTREMITY ROM: WNL without pain - Pt reports using more force with UE is painful to neck and head such as dressing  UPPER EXTREMITY MMT:  5/5 bil UE  CERVICAL SPECIAL TESTS:  Relief with traction    TREATMENT DATE:  09/25/23 UBE 3x2 fwd/bwd PT present to discuss progress ROM update NDI update Trigger Point Dry Needling Subsequent Treatment: Instructions provided previously at initial dry needling treatment.  Patient Verbal Consent Given: Yes Education Handout Provided: Previously Provided Muscles Treated: bil SO, Pt requested to stop after bil SO Electrical Stimulation Performed: No Treatment Response/Outcome: signif twitch, ache, and re-creation of headache Supine SO release and STM Neck flexion passive stretch with contract/relax at end range into retraction Mob with movement for bil rot Rt SCM STM Moist heat x6' end of session   09/21/2023 UBE 3/3 6 total- PT present to discuss status 3 way scapular stabilization with yellow loop x 10 each side 4 D ball rolls with 2 lb (yellow) plyo ball x 20 each direction on each arm Y's at wall 2 x 10 Standing matrix lat pull down 2 x 10 with 35# Single arm row + thoracic rotation x 12 bilateral  Prone shoulder extension, rows and horizontal abduction x 20 with 2 lbs bilateral  Side lying ER with 2 lbs 2 x 10 bilateral  Supine serratus punch with 2 lbs x 20 bilateral     09/18/2023 UBE 3/3 6 total- PT present to discuss status 3 way scapular stabilization with yellow loop x 8 each side 4 D ball rolls with 2 lb (light blue) plyo ball x 20 each direction on each arm Prone shoulder extension, rows and horizontal abduction x 20 each with 2 lbs Side lying ER with 2 lbs 2  x 10 (patient needed mod verbal and tactile cue for correct technique) Supine serratus punch with 2 lbs x 20 Trigger Point Dry Needling Initial Treatment: Pt instructed on Dry  Needling rational, procedures, and possible side effects. Pt instructed to expect mild to moderate muscle soreness later in the day and/or into the next day.  Pt instructed in methods to reduce muscle soreness. Pt instructed to continue prescribed HEP. Because Dry Needling was performed over or adjacent to a lung field, pt was educated on S/S of pneumothorax and to seek immediate medical attention should they occur.  Patient was educated on signs and symptoms of infection and other risk factors and advised to seek medical attention should they occur.  Patient verbalized understanding of these instructions and education.   Patient Verbal Consent Given: Yes Education Handout Provided: Previously Provided Muscles Treated: bilateral upper traps Electrical Stimulation Performed: No Treatment Response/Outcome: Skilled palpation used to identify taut bands and trigger points.  Once identified, dry needling techniques used to treat these areas.  Twitch response ellicited along with palpable elongation of muscle.  Following treatment, patient reported increased neck mobility and less tension in the upper trap areas    PATIENT EDUCATION:  Education details: [id number] Person educated: Patient Education method: Explanation, Demonstration, Verbal cues, and Handouts Education comprehension: verbalized understanding and returned demonstration  HOME EXERCISE PROGRAM: Access Code: [email or web address] URL: [email or web address] Date: 09/03/2023 Prepared by: Kristeen Sar  Exercises - Supine Cervical Retraction with Towel  - 1 x daily - 7 x weekly - 1 sets - 10 reps - 5 hold - Supine Cervical Rotation AROM on Pillow  - 1 x daily - 7 x weekly - 1 sets - 10 reps - 5 hold - Supine Isometric Neck Sidebend  - 1 x daily - 7 x weekly - 1 sets - 10 reps - 5 sec hold - Sidelying Open Book Thoracic Rotation with Knee on Foam Roll  - 1 x daily - 7 x weekly - 1 sets - 10 reps - Cat Cow  - 1 x daily - 7 x weekly - 1  sets - 10 reps  ASSESSMENT:  CLINICAL IMPRESSION: Romy is much improved for pain and some aspects of ROM but continues to be limited and painful in Lt>Rt A/ROM with rotation.  Her pain will sometimes spread into a posterior headache.  Her NDI today improved from 11/50 to 17/50, meeting goal.  She was open to trying more DN but only tolerates 1-2 muscle groups at a time.  PT performed DN to bil SO given headaches and upper cervical ROM limitations.  Heat and manual therapy performed after treatment to reduce soreness.  Pt with 15 degrees gained into Lt cervical rotation end of session. She will benefit from DN to cervical multifidi in mid and lower c-spine next time if tolerated to release tension around facet joints.   OBJECTIVE IMPAIRMENTS: decreased activity tolerance, decreased mobility, decreased ROM, decreased strength, hypomobility, increased muscle spasms, impaired flexibility, impaired tone, impaired UE functional use, improper body mechanics, postural dysfunction, and pain.   ACTIVITY LIMITATIONS: carrying, lifting, sitting, standing, sleeping, bed mobility, bathing, dressing, reach over head, and hygiene/grooming  PARTICIPATION LIMITATIONS: meal prep, cleaning, laundry, driving, shopping, community activity, and occupation  PERSONAL FACTORS: 1 comorbidity: cervical arthritis are also affecting patient's functional outcome.   REHAB POTENTIAL: Excellent  CLINICAL DECISION MAKING: Stable/uncomplicated  EVALUATION COMPLEXITY: Low   GOALS: Goals reviewed with patient? Yes  SHORT TERM GOALS: Target date: 09/18/23  Pt will be ind  with initial HEP Baseline:  Goal status: MET 09/08/23  2.  Pt will be able to restore neutral alignment posture of neck/head secondary to reduced spasm and pain Baseline:  Goal status: In Progress 09/21/2023  3.  Pt will be able to turn head with driving with min pain Baseline:  Goal status: ONGOING 09/21/2023 (still has pain she does not turn her head  much)  4.  Pt will be able to sleep in varied positions with min pain Baseline:  Goal status: MET 09/25/23   LONG TERM GOALS: Target date: 10/16/23  Pt will be ind with advanced HEP and understand importance of compliance for maintenance of gains from PT.  Baseline:  Goal status: ongoing 09/25/23  2.  NDI score improved to 12/50 or less to demo improved function Baseline: 17/50 Goal status: MET 09/25/23 11/50  3.  Pt will be able to don/doff sports bra without neck pain Baseline:  Goal status: INITIAL  4.  Pt will report resolution of posterior headaches so she can more fully concentrate and perform daily tasks. Baseline:  Goal status: ONGOING 8/1     PLAN:  PT FREQUENCY: 1-2x/week  PT DURATION: 8 weeks  PLANNED INTERVENTIONS: 97110-Therapeutic exercises, 97530- Therapeutic activity, 97112- Neuromuscular re-education, 97535- Self Care, 02859- Manual therapy, G0283- Electrical stimulation (unattended), [id number]- Electrical stimulation (manual), [id number]- Traction (mechanical), 20560 (1-2 muscles), 20561 (3+ muscles)- Dry Needling, Patient/Family education, Taping, Joint mobilization, Spinal mobilization, Cryotherapy, and Moist heat  PLAN FOR NEXT SESSION: Pt going out of town and will return Aug 15, if willing, DN cervical multifidi - Pt only tolerates a few muscles at a time, Postural strengthening, scapular stabilization, manual techniques to reduce pain and spasm of neck, neck ROM, neck stabilization    Orvil Fester, PT 09/25/23 11:55 AM  [facility] [address] [place], [place] [address] Phone # [phone] Fax [phone]

## 2023-09-30 ENCOUNTER — Ambulatory Visit

## 2023-10-02 ENCOUNTER — Encounter

## 2023-10-07 ENCOUNTER — Encounter: Admitting: Physical Therapy

## 2023-10-09 ENCOUNTER — Ambulatory Visit

## 2023-10-14 ENCOUNTER — Ambulatory Visit

## 2023-10-14 DIAGNOSIS — M6281 Muscle weakness (generalized): Secondary | ICD-10-CM | POA: Diagnosis not present

## 2023-10-14 DIAGNOSIS — R293 Abnormal posture: Secondary | ICD-10-CM

## 2023-10-14 DIAGNOSIS — R252 Cramp and spasm: Secondary | ICD-10-CM | POA: Diagnosis not present

## 2023-10-14 DIAGNOSIS — M542 Cervicalgia: Secondary | ICD-10-CM | POA: Diagnosis not present

## 2023-10-14 NOTE — Therapy (Signed)
 OUTPATIENT PHYSICAL THERAPY CERVICAL TREATMENT PHYSICAL THERAPY DISCHARGE SUMMARY  Visits from Start of Care: 9  Current functional level related to goals / functional outcomes: See below: patient missed her last appt, no showed, call placed, she forgot but was ok with DC as we discussed   Remaining deficits: See below   Education / Equipment: See below   Patient agrees to discharge. Patient goals were met. Patient is being discharged due to meeting the stated rehab goals.    Patient Name: Cathy Camacho MRN: [id number] DOB:[birth year], 63 y.o., female Today's Date: 10/14/2023  END OF SESSION:  PT End of Session - 10/14/23 0930     Visit Number 9    Date for PT Re-Evaluation 10/16/23    Authorization Type [employer] no auth req'd - Pt signed DN form for Aug    PT Start Time 0928    PT Stop Time 1006    PT Time Calculation (min) 38 min    Activity Tolerance Patient tolerated treatment well    Behavior During Therapy [facility] for tasks assessed/performed              Past Medical History:  Diagnosis Date   Hyperlipidemia    Osteoporosis    Vitamin B12 deficiency    Vitamin D  deficiency    Past Surgical History:  Procedure Laterality Date   APPENDECTOMY     Patient Active Problem List   Diagnosis Date Noted   Pelvic pain 06/16/2022   Hyperlipidemia 10/19/2020   Vitamin D  deficiency 10/19/2020   Vitamin B12 deficiency 10/19/2020   Osteoporosis 10/19/2020   Globus sensation 09/20/2015   Hoarseness 09/20/2015   Laryngopharyngeal reflux (LPR) 09/20/2015    PCP: Theophilus Andrews, Tully GRADE, MD  REFERRING PROVIDER: Theophilus Andrews, Tully GRADE, MD  REFERRING DIAG: M54.2 (ICD-10-CM) - Cervicalgia  THERAPY DIAG:  Cervicalgia  Abnormal posture  Muscle weakness (generalized)  Cramp and spasm  Rationale for Evaluation and Treatment: Rehabilitation  ONSET DATE: 1 month  SUBJECTIVE:                                                                                                                                                                                                          SUBJECTIVE STATEMENT: Patient reports she continues to feel great and minimal to no neck pain or headaches.  She responded very well to the DN last visit.  She feels like she will be ready for DC on Friday.     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  About  a month ago I took a nap for about 20 min and woke up with lots of pain.  I took a trip and it's still there.  I have a headache in the back of my head, my neck is so stiff and I can't turn either way.  Tylenol helps but when it wears off I can really tell.  It feels too hard to hold my head up.  I am not sleeping well but I got a better contoured pillow which helps my neck.  Hand dominance: Right  PERTINENT HISTORY:  osteoporosis  PAIN:  PAIN: 09/18/2023 Are you having pain? Yes NPRS scale: 3/10 Pain location: back of head, posterior neck to shoulders bil, into intrascapular region Pain orientation: Bilateral  PAIN TYPE: aching, dull, sharp, and tight Pain description: constant  Aggravating factors: holding head up, turning head left and right, trying to sleep Relieving factors: Tylenol   PRECAUTIONS: None  RED FLAGS: None     WEIGHT BEARING RESTRICTIONS: No  FALLS:  Has patient fallen in last 6 months? 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  DIAGNOSTIC FINDINGS:  None at this time  PATIENT SURVEYS:  09/25/23 NDI: 11/50  Eval: NDI: 17/50  Minimum Detectable Change (90% confidence): 5 points or 10% points  COGNITION: Overall cognitive status: Within functional limits for tasks assessed  SENSATION: WFL  POSTURE:  carrying head in forward flexed position secondary to pain  PALPATION: Signif spasm present Rt>Lt SO, cervical paraspinals, upper trap, deep multifii   CERVICAL ROM:   Active ROM A/PROM (deg) eval A/ROM 09/25/23  Flexion 30* 40  Extension 65 55  Right lateral flexion 20* 30  Left lateral flexion 25* 30  Right rotation 50* 50* pre-DN, 50 no pain after DN  Left rotation 30* 35* pre-DN, 50 no pain after DN   (Blank rows = not tested) *pain  UPPER EXTREMITY ROM: WNL without pain - Pt reports using more force with UE is painful to neck and head such as dressing  UPPER EXTREMITY MMT:  5/5 bil UE  CERVICAL SPECIAL TESTS:  Relief with traction    TREATMENT DATE:  10/14/2023 UBE 3/3 6 total- PT present to discuss status, goals and DC plan 3 way scapular stabilization with yellow loop x 10 each side 4 D ball rolls with 2 lb (yellow) plyo ball x 20 each direction on each arm Standing matrix lat pull down 2 x 10 with 35# Prone wing taps 2 x 10 Prone pull downs 2 x 10 Seated tband rows (PT anchoring band) 2 x 10 with green band with handles Seated bilateral shoulder ER with green tband 2 x 10 Seated bilateral shoulder horizontal abduction x 20 with green band Trigger Point Dry Needling Subsequent Treatment: Instructions provided previously at initial dry needling treatment.  Patient Verbal Consent Given: Yes Education Handout Provided: Previously Provided Muscles Treated: bilateral upper traps Electrical Stimulation Performed: No Treatment Response/Outcome: Skilled palpation used to identify taut bands and trigger points.  Once identified, dry needling techniques used to treat these areas.  Deep ache response ellicited along with reported decreased muscle tension.  Following treatment, patient reported no pain.      09/25/23 UBE 3x2 fwd/bwd PT present to discuss progress ROM update NDI update Trigger Point  Dry Needling Subsequent Treatment: Instructions provided previously at  initial dry needling treatment.  Patient Verbal Consent Given: Yes Education Handout Provided: Previously Provided Muscles Treated: bil SO, Pt requested to stop after bil SO Electrical Stimulation Performed: No Treatment Response/Outcome: signif twitch, ache, and re-creation of headache Supine SO release and STM Neck flexion passive stretch with contract/relax at end range into retraction Mob with movement for bil rot Rt SCM STM Moist heat x6' end of session   09/21/2023 UBE 3/3 6 total- PT present to discuss status 3 way scapular stabilization with yellow loop x 10 each side 4 D ball rolls with 2 lb (yellow) plyo ball x 20 each direction on each arm Y's at wall 2 x 10 Standing matrix lat pull down 2 x 10 with 35# Single arm row + thoracic rotation x 12 bilateral  Prone shoulder extension, rows and horizontal abduction x 20 with 2 lbs bilateral  Side lying ER with 2 lbs 2 x 10 bilateral  Supine serratus punch with 2 lbs x 20 bilateral   PATIENT EDUCATION:  Education details: [email or web address] Person educated: Patient Education method: Explanation, Demonstration, Verbal cues, and Handouts Education comprehension: verbalized understanding and returned demonstration  HOME EXERCISE PROGRAM: Access Code: [email or web address] URL: [email or web address] Date: 09/03/2023 Prepared by: Kristeen Sar  Exercises - Supine Cervical Retraction with Towel  - 1 x daily - 7 x weekly - 1 sets - 10 reps - 5 hold - Supine Cervical Rotation AROM on Pillow  - 1 x daily - 7 x weekly - 1 sets - 10 reps - 5 hold - Supine Isometric Neck Sidebend  - 1 x daily - 7 x weekly - 1 sets - 10 reps - 5 sec hold - Sidelying Open Book Thoracic Rotation with Knee on Foam Roll  - 1 x daily - 7 x weekly - 1 sets - 10 reps - Cat Cow  - 1 x daily - 7 x weekly - 1 sets - 10 reps  ASSESSMENT:  CLINICAL IMPRESSION: Finlay is progressing toward her final goals.  She is pain free most of the time, per her report. She met 3 more  goals today.  She is compliant and well motivated and should continue to do well.  We will likely DC next visit if remaining goals met.     OBJECTIVE IMPAIRMENTS: decreased activity tolerance, decreased mobility, decreased ROM, decreased strength, hypomobility, increased muscle spasms, impaired flexibility, impaired tone, impaired UE functional use, improper body mechanics, postural dysfunction, and pain.   ACTIVITY LIMITATIONS: carrying, lifting, sitting, standing, sleeping, bed mobility, bathing, dressing, reach over head, and hygiene/grooming  PARTICIPATION LIMITATIONS: meal prep, cleaning, laundry, driving, shopping, community activity, and occupation  PERSONAL FACTORS: 1 comorbidity: cervical arthritis are also affecting patient's functional outcome.   REHAB POTENTIAL: Excellent  CLINICAL DECISION MAKING: Stable/uncomplicated  EVALUATION COMPLEXITY: Low   GOALS: Goals reviewed with patient? Yes  SHORT TERM GOALS: Target date: 09/18/23  Pt will be ind with initial HEP Baseline:  Goal status: MET 09/08/23  2.  Pt will be able to restore neutral alignment posture of neck/head secondary to reduced spasm and pain Baseline:  Goal status: MET 10/14/23  3.  Pt will be able to turn head with driving with min pain Baseline:  Goal status: MET 10/14/23  4.  Pt will be able to sleep in varied positions with min pain Baseline:  Goal status: MET 09/25/23   LONG TERM GOALS: Target date: 10/16/23  Pt will be ind with advanced HEP and  understand importance of compliance for maintenance of gains from PT.  Baseline:  Goal status: MET 10/14/23  2.  NDI score improved to 12/50 or less to demo improved function Baseline: 17/50 Goal status: MET 09/25/23 11/50  3.  Pt will be able to don/doff sports bra without neck pain Baseline:  Goal status: MET 10/14/23  4.  Pt will report resolution of posterior headaches so she can more fully concentrate and perform daily tasks. Baseline:  Goal status:  ONGOING 8/1     PLAN:  PT FREQUENCY: 1-2x/week  PT DURATION: 8 weeks  PLANNED INTERVENTIONS: 97110-Therapeutic exercises, 97530- Therapeutic activity, 97112- Neuromuscular re-education, 97535- Self Care, 02859- Manual therapy, G0283- Electrical stimulation (unattended), [phone]- Electrical stimulation (manual), [id number]- Traction (mechanical), 20560 (1-2 muscles), 20561 (3+ muscles)- Dry Needling, Patient/Family education, Taping, Joint mobilization, Spinal mobilization, Cryotherapy, and Moist heat  PLAN FOR NEXT SESSION: Pt going out of town and will return Aug 15, if willing, DN cervical multifidi - Pt only tolerates a few muscles at a time, Postural strengthening, scapular stabilization, manual techniques to reduce pain and spasm of neck, neck ROM, neck stabilization    Nezzie Manera B. Shailen Thielen, PT 10/14/23 10:11 AM [facility] [address], [address] [place], [place] [address] Phone # [phone] Fax [phone]

## 2023-10-16 ENCOUNTER — Telehealth: Payer: Self-pay

## 2023-10-16 ENCOUNTER — Ambulatory Visit

## 2023-10-16 NOTE — Telephone Encounter (Signed)
 Patient answered.  She states she completely forgot.  Explained to her that this was her last visit for her auth period and that we had discussed DC.  She states she is feeling good and does not need her last visit.  Ok to DC and she will continue her HEP.  Very pleased with her progress. May stop by for HEP printouts, she misplaced hers.
